# Patient Record
Sex: Male | Born: 1955 | Race: White | Hispanic: No | Marital: Married | Smoking: Never smoker
Health system: Southern US, Community
[De-identification: ages and names within clinical notes are randomized; demographics above are authoritative.]

## PROBLEM LIST (undated history)

## (undated) HISTORY — PX: BACK SURGERY: SHX140

---

## 2018-03-20 ENCOUNTER — Other Ambulatory Visit: Payer: Self-pay

## 2018-03-20 ENCOUNTER — Ambulatory Visit
Admission: EM | Admit: 2018-03-20 | Discharge: 2018-03-20 | Disposition: A | Discharge status: Home / Self Care | Payer: No Typology Code available for payment source | Attending: Family Medicine | Admitting: Family Medicine

## 2018-03-20 DIAGNOSIS — L03213 Periorbital cellulitis: Secondary | ICD-10-CM | POA: Diagnosis not present

## 2018-03-20 MED ORDER — DOXYCYCLINE HYCLATE 100 MG PO TABS: 100.0000 mg | ORAL_TABLET | Freq: Two times a day (BID) | ORAL | 0 refills | Status: DC

## 2018-03-20 NOTE — ED Provider Notes (Signed)
[facility]    CSN: [id number] Arrival date & time: 03/20/18  [id number]     History   Chief Complaint Chief Complaint  Patient presents with  . Eye Pain    HPI Lequita HaltMark Rosamilia is a 62 y.o. male.   62 yo male with a c/o right eyelid pain, redness and swelling for 2 days. Denies any injuries, fevers, chills, drainage.   The history is provided by the patient.    History reviewed. No pertinent past medical history.  There are no active problems to display for this patient.   Past Surgical History:  Procedure Laterality Date  . BACK SURGERY         Home Medications    Prior to Admission medications   Medication Sig Start Date End Date Taking? Authorizing Provider  aspirin EC 81 MG tablet Take 81 mg by mouth daily.   Yes [provider]  doxycycline (VIBRA-TABS) 100 MG tablet Take 1 tablet (100 mg total) by mouth 2 (two) times daily. 03/20/18   Payton Mccallumonty, Giovonnie Trettel, MD    Family History History reviewed. No pertinent family history.  Social History Social History   Tobacco Use  . Smoking status: Never Smoker  . Smokeless tobacco: Never Used  Substance Use Topics  . Alcohol use: Yes    Comment: socially  . Drug use: Not Currently     Allergies   Penicillins and Sulfa antibiotics   Review of Systems Review of Systems   Physical Exam Triage Vital Signs ED Triage Vitals  Enc Vitals Group     BP 03/20/18 0850 (!) 159/93     Pulse Rate 03/20/18 0850 (!) 55     Resp 03/20/18 0850 18     Temp 03/20/18 0850 98.4 F (36.9 C)     Temp Source 03/20/18 0850 Oral     SpO2 03/20/18 0850 100 %     Weight 03/20/18 0847 194 lb (88 kg)     Height 03/20/18 0847 6\' 1"  (1.854 m)     Head Circumference --      Peak Flow --      Pain Score 03/20/18 0847 0     Pain Loc --      Pain Edu? --      Excl. in GC? --    No data found.  Updated Vital Signs BP (!) 159/93 (BP Location: Right Arm)   Pulse (!) 55   Temp 98.4 F (36.9 C) (Oral)   Resp 18    Ht 6\' 1"  (1.854 m)   Wt 88 kg   SpO2 100%   BMI 25.60 kg/m   Visual Acuity Right Eye Distance: 20/25(uncorrected) Left Eye Distance: 20/25(uncorrected) Bilateral Distance: 20/25(uncorrected)  Right Eye Near:   Left Eye Near:    Bilateral Near:     Physical Exam Vitals signs and nursing note reviewed.  Constitutional:      General: He is not in acute distress.    Appearance: He is not diaphoretic.  Eyes:     General:        Right eye: Hordeolum (lower eyelid) present.     Extraocular Movements: Extraocular movements intact.     Conjunctiva/sclera: Conjunctivae normal.     Pupils: Pupils are equal, round, and reactive to light.     Comments: Right lower eyelid with diffuse blanchable erythema and edema   Neurological:     Mental Status: He is alert.      UC Treatments / Results  Labs (all labs  ordered are listed, but only abnormal results are displayed) Labs Reviewed - No data to display  EKG None  Radiology No results found.  Procedures Procedures (including critical care time)  Medications Ordered in UC Medications - No data to display  Initial Impression / Assessment and Plan / UC Course  I have reviewed the triage vital signs and the nursing notes.  Pertinent labs & imaging results that were available during my care of the patient were reviewed by me and considered in my medical decision making (see chart for details).      Final Clinical Impressions(s) / UC Diagnoses   Final diagnoses:  Preseptal cellulitis of right eye    ED Prescriptions    Medication Sig Dispense Auth. Provider   doxycycline (VIBRA-TABS) 100 MG tablet Take 1 tablet (100 mg total) by mouth 2 (two) times daily. 14 tablet Payton Mccallumonty, Issak Goley, MD     1.diagnosis reviewed with patient 2. rx as per orders above; reviewed possible side effects, interactions, risks and benefits  3. Recommend supportive treatment with warm compresses to area 4. Follow-up prn if symptoms worsen or don't  improve    Controlled Substance Prescriptions [place] Controlled Substance Registry consulted? Not Applicable   Payton Mccallumonty, Pawan Knechtel, MD 03/20/18 [phone]

## 2018-03-20 NOTE — ED Triage Notes (Signed)
Patient complains of right eye pain on lower eyelid. Patient also has some swelling under eye that started over last few days.

## 2019-11-26 ENCOUNTER — Encounter
Admission: EM | Disposition: A | Discharge status: Home / Self Care | Payer: Self-pay | Source: Home / Self Care | Attending: Internal Medicine

## 2019-11-26 ENCOUNTER — Encounter: Payer: Self-pay | Admitting: Emergency Medicine

## 2019-11-26 ENCOUNTER — Other Ambulatory Visit: Payer: Self-pay

## 2019-11-26 ENCOUNTER — Emergency Department: Payer: PRIVATE HEALTH INSURANCE

## 2019-11-26 ENCOUNTER — Inpatient Hospital Stay
Admission: EM | Admit: 2019-11-26 | Discharge: 2019-11-27 | DRG: 246 | Disposition: A | Discharge status: Home / Self Care | Payer: PRIVATE HEALTH INSURANCE | Attending: Internal Medicine | Admitting: Internal Medicine

## 2019-11-26 ENCOUNTER — Other Ambulatory Visit
Admission: RE | Admit: 2019-11-26 | Discharge: 2019-11-26 | Disposition: A | Discharge status: Home / Self Care | Payer: PRIVATE HEALTH INSURANCE | Source: Ambulatory Visit | Attending: Family Medicine | Admitting: Family Medicine

## 2019-11-26 DIAGNOSIS — Z0000 Encounter for general adult medical examination without abnormal findings: Secondary | ICD-10-CM | POA: Insufficient documentation

## 2019-11-26 DIAGNOSIS — Z125 Encounter for screening for malignant neoplasm of prostate: Secondary | ICD-10-CM | POA: Insufficient documentation

## 2019-11-26 DIAGNOSIS — Z7982 Long term (current) use of aspirin: Secondary | ICD-10-CM

## 2019-11-26 DIAGNOSIS — I214 Non-ST elevation (NSTEMI) myocardial infarction: Principal | ICD-10-CM

## 2019-11-26 DIAGNOSIS — I2510 Atherosclerotic heart disease of native coronary artery without angina pectoris: Secondary | ICD-10-CM | POA: Diagnosis present

## 2019-11-26 DIAGNOSIS — I249 Acute ischemic heart disease, unspecified: Secondary | ICD-10-CM | POA: Diagnosis not present

## 2019-11-26 DIAGNOSIS — Z882 Allergy status to sulfonamides status: Secondary | ICD-10-CM

## 2019-11-26 DIAGNOSIS — R079 Chest pain, unspecified: Secondary | ICD-10-CM | POA: Insufficient documentation

## 2019-11-26 DIAGNOSIS — Z79899 Other long term (current) drug therapy: Secondary | ICD-10-CM

## 2019-11-26 DIAGNOSIS — Z131 Encounter for screening for diabetes mellitus: Secondary | ICD-10-CM | POA: Diagnosis not present

## 2019-11-26 DIAGNOSIS — Z955 Presence of coronary angioplasty implant and graft: Secondary | ICD-10-CM | POA: Diagnosis not present

## 2019-11-26 DIAGNOSIS — Z880 Allergy status to penicillin: Secondary | ICD-10-CM

## 2019-11-26 DIAGNOSIS — Z1159 Encounter for screening for other viral diseases: Secondary | ICD-10-CM | POA: Diagnosis not present

## 2019-11-26 DIAGNOSIS — Z136 Encounter for screening for cardiovascular disorders: Secondary | ICD-10-CM | POA: Insufficient documentation

## 2019-11-26 DIAGNOSIS — N289 Disorder of kidney and ureter, unspecified: Secondary | ICD-10-CM | POA: Diagnosis present

## 2019-11-26 DIAGNOSIS — Z20822 Contact with and (suspected) exposure to covid-19: Secondary | ICD-10-CM | POA: Diagnosis present

## 2019-11-26 DIAGNOSIS — N400 Enlarged prostate without lower urinary tract symptoms: Secondary | ICD-10-CM | POA: Diagnosis present

## 2019-11-26 HISTORY — PX: LEFT HEART CATH AND CORONARY ANGIOGRAPHY: CATH118249

## 2019-11-26 HISTORY — PX: CORONARY STENT INTERVENTION: CATH118234

## 2019-11-26 LAB — CBC
HCT: 45.9 % (ref 39.0–52.0)
Hemoglobin: 15.4 g/dL (ref 13.0–17.0)
MCH: 28.4 pg (ref 26.0–34.0)
MCHC: 33.6 g/dL (ref 30.0–36.0)
MCV: 84.5 fL (ref 80.0–100.0)
Platelets: 232 10*3/uL (ref 150–400)
RBC: 5.43 MIL/uL (ref 4.22–5.81)
RDW: 14 % (ref 11.5–15.5)
WBC: 7.8 10*3/uL (ref 4.0–10.5)
nRBC: 0 % (ref 0.0–0.2)

## 2019-11-26 LAB — BASIC METABOLIC PANEL
Anion gap: 6 (ref 5–15)
BUN: 32 mg/dL — ABNORMAL HIGH (ref 8–23)
CO2: 24 mmol/L (ref 22–32)
Calcium: 8.9 mg/dL (ref 8.9–10.3)
Chloride: 108 mmol/L (ref 98–111)
Creatinine, Ser: 1.32 mg/dL — ABNORMAL HIGH (ref 0.61–1.24)
GFR calc Af Amer: >60 mL/min (ref >60)
GFR calc non Af Amer: 57 mL/min — ABNORMAL LOW (ref >60)
Glucose, Bld: 84 mg/dL (ref 70–99)
Potassium: 4 mmol/L (ref 3.5–5.1)
Sodium: 138 mmol/L (ref 135–145)

## 2019-11-26 LAB — HEPARIN LEVEL (UNFRACTIONATED): Heparin Unfractionated: <0.1 IU/mL — ABNORMAL LOW (ref 0.30–0.70)

## 2019-11-26 LAB — APTT: aPTT: 36 seconds (ref 24–36)

## 2019-11-26 LAB — TROPONIN I (HIGH SENSITIVITY)
Troponin I (High Sensitivity): 8688 ng/L (ref <18)
Troponin I (High Sensitivity): 7068 ng/L (ref <18)
Troponin I (High Sensitivity): 6827 ng/L (ref <18)

## 2019-11-26 LAB — POCT ACTIVATED CLOTTING TIME: Activated Clotting Time: 346 seconds

## 2019-11-26 LAB — PROTIME-INR
INR: 0.9 (ref 0.8–1.2)
Prothrombin Time: 12.1 seconds (ref 11.4–15.2)

## 2019-11-26 LAB — SARS CORONAVIRUS 2 BY RT PCR (HOSPITAL ORDER, PERFORMED IN ~~LOC~~ HOSPITAL LAB): SARS Coronavirus 2: NEGATIVE

## 2019-11-26 LAB — CKMB (ARMC ONLY): CK, MB: 74.3 ng/mL — ABNORMAL HIGH (ref 0.5–5.0)

## 2019-11-26 SURGERY — LEFT HEART CATH AND CORONARY ANGIOGRAPHY
Anesthesia: Moderate Sedation

## 2019-11-26 MED ORDER — SODIUM CHLORIDE 0.9 % IV SOLN
0.2500 mg/kg/h | INTRAVENOUS | Status: AC
  Filled 2019-11-26: qty 250

## 2019-11-26 MED ORDER — TAMSULOSIN HCL 0.4 MG PO CAPS: 0.4000 mg | ORAL_CAPSULE | Freq: Every day | ORAL | Status: DC

## 2019-11-26 MED ORDER — ASPIRIN 81 MG PO CHEW: 81.0000 mg | CHEWABLE_TABLET | ORAL | Status: DC

## 2019-11-26 MED ORDER — HEPARIN (PORCINE) 25000 UT/250ML-% IV SOLN
1100.0000 [IU]/h | INTRAVENOUS | Status: DC
  Administered 2019-11-26: 1100 [IU]/h via INTRAVENOUS
  Filled 2019-11-26: qty 250

## 2019-11-26 MED ORDER — HEPARIN BOLUS VIA INFUSION
4000.0000 [IU] | Freq: Once | INTRAVENOUS | Status: AC
  Administered 2019-11-26: 4000 [IU] via INTRAVENOUS
  Filled 2019-11-26: qty 4000

## 2019-11-26 MED ORDER — ACETAMINOPHEN 650 MG RE SUPP: 650.0000 mg | Freq: Four times a day (QID) | RECTAL | Status: DC | PRN

## 2019-11-26 MED ORDER — BIVALIRUDIN BOLUS VIA INFUSION - CUPID
INTRAVENOUS | Status: DC | PRN
  Administered 2019-11-26: 67.05 mg via INTRAVENOUS

## 2019-11-26 MED ORDER — LABETALOL HCL 5 MG/ML IV SOLN
INTRAVENOUS | Status: AC
  Filled 2019-11-26: qty 4

## 2019-11-26 MED ORDER — FENTANYL CITRATE (PF) 100 MCG/2ML IJ SOLN
INTRAMUSCULAR | Status: AC
  Filled 2019-11-26: qty 2

## 2019-11-26 MED ORDER — BIVALIRUDIN TRIFLUOROACETATE 250 MG IV SOLR
INTRAVENOUS | Status: AC
  Filled 2019-11-26: qty 250

## 2019-11-26 MED ORDER — ASPIRIN EC 81 MG PO TBEC
81.0000 mg | DELAYED_RELEASE_TABLET | Freq: Every day | ORAL | Status: DC
  Filled 2019-11-26: qty 1

## 2019-11-26 MED ORDER — FENTANYL CITRATE (PF) 100 MCG/2ML IJ SOLN
INTRAMUSCULAR | Status: DC | PRN
Start: 2019-11-26 — End: 2019-11-26
  Administered 2019-11-26: 25 ug via INTRAVENOUS
  Administered 2019-11-26: 50 ug via INTRAVENOUS

## 2019-11-26 MED ORDER — ASPIRIN 81 MG PO CHEW
243.0000 mg | CHEWABLE_TABLET | Freq: Once | ORAL | Status: AC
  Administered 2019-11-26: 243 mg via ORAL
  Filled 2019-11-26: qty 3

## 2019-11-26 MED ORDER — SODIUM CHLORIDE 0.9 % IV SOLN
INTRAVENOUS | Status: AC | PRN
  Administered 2019-11-26: 250 mL via INTRAVENOUS

## 2019-11-26 MED ORDER — SODIUM CHLORIDE 0.9% FLUSH: 3.0000 mL | Freq: Two times a day (BID) | INTRAVENOUS | Status: DC

## 2019-11-26 MED ORDER — SODIUM CHLORIDE 0.9 % WEIGHT BASED INFUSION: 1.0000 mL/kg/h | INTRAVENOUS | Status: DC

## 2019-11-26 MED ORDER — FENTANYL CITRATE (PF) 100 MCG/2ML IJ SOLN
INTRAMUSCULAR | Status: DC | PRN
Start: 2019-11-26 — End: 2019-11-26
  Administered 2019-11-26 (×3): 25 ug via INTRAVENOUS

## 2019-11-26 MED ORDER — LABETALOL HCL 5 MG/ML IV SOLN: 10.0000 mg | INTRAVENOUS | Status: AC | PRN

## 2019-11-26 MED ORDER — ATORVASTATIN CALCIUM 20 MG PO TABS
40.0000 mg | ORAL_TABLET | Freq: Every day | ORAL | Status: DC
  Administered 2019-11-26 – 2019-11-27 (×2): 40 mg via ORAL
  Filled 2019-11-26 (×2): qty 2

## 2019-11-26 MED ORDER — HYDRALAZINE HCL 20 MG/ML IJ SOLN
INTRAMUSCULAR | Status: DC | PRN
  Administered 2019-11-26: 20 mg via INTRAVENOUS

## 2019-11-26 MED ORDER — SODIUM CHLORIDE 0.9% FLUSH: 3.0000 mL | INTRAVENOUS | Status: DC | PRN

## 2019-11-26 MED ORDER — HYDRALAZINE HCL 20 MG/ML IJ SOLN: 10.0000 mg | INTRAMUSCULAR | Status: AC | PRN

## 2019-11-26 MED ORDER — METOPROLOL TARTRATE 25 MG PO TABS
25.0000 mg | ORAL_TABLET | Freq: Two times a day (BID) | ORAL | Status: DC
  Administered 2019-11-26 – 2019-11-27 (×2): 25 mg via ORAL
  Filled 2019-11-26 (×2): qty 1

## 2019-11-26 MED ORDER — TICAGRELOR 90 MG PO TABS
90.0000 mg | ORAL_TABLET | Freq: Two times a day (BID) | ORAL | Status: DC
  Administered 2019-11-26: 90 mg via ORAL
  Filled 2019-11-26: qty 1

## 2019-11-26 MED ORDER — ASPIRIN EC 81 MG PO TBEC: 81.0000 mg | DELAYED_RELEASE_TABLET | Freq: Every day | ORAL | Status: DC

## 2019-11-26 MED ORDER — ASPIRIN 81 MG PO CHEW
81.0000 mg | CHEWABLE_TABLET | Freq: Every day | ORAL | Status: DC
  Administered 2019-11-27: 81 mg via ORAL
  Filled 2019-11-26: qty 1

## 2019-11-26 MED ORDER — SODIUM CHLORIDE 0.9 % WEIGHT BASED INFUSION
1.0000 mL/kg/h | INTRAVENOUS | Status: AC
  Administered 2019-11-26: 1 mL/kg/h via INTRAVENOUS

## 2019-11-26 MED ORDER — SODIUM CHLORIDE 0.9 % IV SOLN
INTRAVENOUS | Status: AC | PRN
  Administered 2019-11-26 (×2): 1.75 mg/kg/h via INTRAVENOUS

## 2019-11-26 MED ORDER — MIDAZOLAM HCL 2 MG/2ML IJ SOLN
INTRAMUSCULAR | Status: DC | PRN
  Administered 2019-11-26 (×2): 1 mg via INTRAVENOUS

## 2019-11-26 MED ORDER — SODIUM CHLORIDE 0.9 % IV SOLN: 250.0000 mL | INTRAVENOUS | Status: DC | PRN

## 2019-11-26 MED ORDER — ACETAMINOPHEN 325 MG PO TABS: 650.0000 mg | ORAL_TABLET | ORAL | Status: DC | PRN

## 2019-11-26 MED ORDER — TICAGRELOR 90 MG PO TABS: 90.0000 mg | ORAL_TABLET | Freq: Two times a day (BID) | ORAL | Status: DC

## 2019-11-26 MED ORDER — IOHEXOL 300 MG/ML  SOLN
INTRAMUSCULAR | Status: DC | PRN
  Administered 2019-11-26: 71 mL

## 2019-11-26 MED ORDER — SODIUM CHLORIDE 0.9 % WEIGHT BASED INFUSION
3.0000 mL/kg/h | INTRAVENOUS | Status: DC
  Administered 2019-11-26: 3 mL/kg/h via INTRAVENOUS

## 2019-11-26 MED ORDER — HEPARIN (PORCINE) IN NACL 1000-0.9 UT/500ML-% IV SOLN
INTRAVENOUS | Status: AC
  Filled 2019-11-26: qty 1000

## 2019-11-26 MED ORDER — ACETAMINOPHEN 325 MG PO TABS: 650.0000 mg | ORAL_TABLET | Freq: Four times a day (QID) | ORAL | Status: DC | PRN

## 2019-11-26 MED ORDER — HEPARIN (PORCINE) IN NACL 1000-0.9 UT/500ML-% IV SOLN
INTRAVENOUS | Status: DC | PRN
  Administered 2019-11-26: 500 mL

## 2019-11-26 MED ORDER — TICAGRELOR 90 MG PO TABS
ORAL_TABLET | ORAL | Status: DC | PRN
  Administered 2019-11-26: 180 mg via ORAL

## 2019-11-26 MED ORDER — IOHEXOL 300 MG/ML  SOLN
INTRAMUSCULAR | Status: DC | PRN
  Administered 2019-11-26: 320 mL via INTRA_ARTERIAL

## 2019-11-26 MED ORDER — HYDRALAZINE HCL 20 MG/ML IJ SOLN
INTRAMUSCULAR | Status: AC
  Filled 2019-11-26: qty 1

## 2019-11-26 MED ORDER — TICAGRELOR 90 MG PO TABS
ORAL_TABLET | ORAL | Status: AC
  Filled 2019-11-26: qty 2

## 2019-11-26 MED ORDER — MIDAZOLAM HCL 2 MG/2ML IJ SOLN
INTRAMUSCULAR | Status: AC
  Filled 2019-11-26: qty 2

## 2019-11-26 MED ORDER — ONDANSETRON HCL 4 MG/2ML IJ SOLN: 4.0000 mg | Freq: Four times a day (QID) | INTRAMUSCULAR | Status: DC | PRN

## 2019-11-26 SURGICAL SUPPLY — 21 items
BALLN TREK RX 2.5X15 (BALLOONS) ×3
BALLOON TREK RX 2.5X15 (BALLOONS) ×1 IMPLANT
CATH INFINITI 5FR ANG PIGTAIL (CATHETERS) ×3 IMPLANT
CATH INFINITI 5FR JL4 (CATHETERS) ×3 IMPLANT
CATH INFINITI JR4 5F (CATHETERS) ×3 IMPLANT
CATH VISTA GUIDE 6FR XB3.5 (CATHETERS) ×3 IMPLANT
DEVICE CLOSURE MYNXGRIP 6/7F (Vascular Products) ×3 IMPLANT
DEVICE SAFEGUARD 24CM (GAUZE/BANDAGES/DRESSINGS) ×3 IMPLANT
KIT ENCORE 26 ADVANTAGE (KITS) ×3 IMPLANT
KIT MANI 3VAL PERCEP (MISCELLANEOUS) ×3 IMPLANT
NEEDLE PERC 18GX7CM (NEEDLE) ×3 IMPLANT
PACK CARDIAC CATH (CUSTOM PROCEDURE TRAY) ×3 IMPLANT
SHEATH AVANTI 5FR X 11CM (SHEATH) ×3 IMPLANT
SHEATH AVANTI 6FR X 11CM (SHEATH) ×3 IMPLANT
STENT RESOLUTE ONYX 2.25X12 (Permanent Stent) ×3 IMPLANT
STENT RESOLUTE ONYX 2.25X26 (Permanent Stent) ×3 IMPLANT
STENT RESOLUTE ONYX 2.5X15 (Permanent Stent) ×3 IMPLANT
STENT RESOLUTE ONYX 2.5X18 (Permanent Stent) ×3 IMPLANT
STENT RESOLUTE ONYX 3.0X12 (Permanent Stent) ×3 IMPLANT
WIRE G HI TQ BMW 190 (WIRE) ×3 IMPLANT
WIRE GUIDERIGHT .035X150 (WIRE) ×3 IMPLANT

## 2019-11-26 NOTE — Consult Note (Signed)
ANTICOAGULATION CONSULT NOTE  Pharmacy Consult for Heparin Infusion Indication: chest pain/ACS  Allergies  Allergen Reactions  . Penicillins Swelling  . Sulfa Antibiotics Rash    Other reaction(s): Unknown Other Reaction: ? RASH    Patient Measurements: Height: 6' (182.9 cm) Weight: 89.4 kg (197 lb) IBW/kg (Calculated) : 77.6 Heparin Dosing Weight: 89.4 kg  Vital Signs: BP: 139/74 (08/31 1135) Pulse Rate: 70 (08/31 1135)  Labs: Recent Labs    11/26/19 0830  CKMB 74.3*  TROPONINIHS 8,688*    CrCl cannot be calculated (No successful lab value found.).   Medical History: History reviewed. No pertinent past medical history.  Medications:  No anticoagulation prior to admission per chart review  Assessment: Patient is a 64 y/o M who presented to [facility] ED on 8/31 with suspected ACS. Troponin elevated to 8688. Pharmacy has been consulted to initiate heparin infusion.  Baseline CBC pending. Baseline aPTT and PT-INR pending.   Goal of Therapy:  Heparin level 0.3-0.7 units/ml Monitor platelets by anticoagulation protocol: Yes   Plan:  --Heparin 4000 unit IV bolus x 1 followed by continuous infusion at 1100 units/hr --HL 6 hours after initiation of infusion --Daily CBC per protocol  Tressie Ellis 11/26/2019,12:06 PM

## 2019-11-26 NOTE — ED Provider Notes (Addendum)
[facility] Emergency Department Provider Note  ____________________________________________   First MD Initiated Contact with Patient 11/26/19 1131     (approximate)  I have reviewed the triage vital signs and the nursing notes.   HISTORY  Chief Complaint Abnormal Lab    HPI Stephen Melendez is a 64 y.o. male  Here with chest pain yesterday. Pt states that symptoms started yesterday while he was golfing. He experienced acute onset of dull, substernal and left-sided chest pressure while golfing. He sat down to rest and felt nauseous, but did not vomit. Sx subsided and he was actually able to golf without difficulty. He was with a physician friend who had him come to the office today. EKG was nonischemic today but trop >8000 so he presents for evaluation. No known h/o CAD. Family h/o cardiac death in father during MV repair. No smoking.        History reviewed. No pertinent past medical history.  There are no problems to display for this patient.   Past Surgical History:  Procedure Laterality Date  . BACK SURGERY      Prior to Admission medications   Medication Sig Start Date End Date Taking? Authorizing Provider  aspirin EC 81 MG tablet Take 81 mg by mouth daily.    [provider]  doxycycline (VIBRA-TABS) 100 MG tablet Take 1 tablet (100 mg total) by mouth 2 (two) times daily. 03/20/18   [name], MD    Allergies Penicillins and Sulfa antibiotics  No family history on file.  Social History Social History   Tobacco Use  . Smoking status: Never Smoker  . Smokeless tobacco: Never Used  Vaping Use  . Vaping Use: Never used  Substance Use Topics  . Alcohol use: Yes    Comment: socially  . Drug use: Not Currently    Review of Systems  Review of Systems  Constitutional: Positive for fatigue. Negative for chills and fever.  HENT: Negative for sore throat.   Respiratory: Positive for chest tightness. Negative for shortness of  breath.   Cardiovascular: Positive for chest pain.  Gastrointestinal: Negative for abdominal pain.  Genitourinary: Negative for flank pain.  Musculoskeletal: Negative for neck pain.  Skin: Negative for rash and wound.  Allergic/Immunologic: Negative for immunocompromised state.  Neurological: Negative for weakness and numbness.  Hematological: Does not bruise/bleed easily.  All other systems reviewed and are negative.    ____________________________________________  PHYSICAL EXAM:      VITAL SIGNS: ED Triage Vitals  Enc Vitals Group     BP      Pulse      Resp      Temp      Temp src      SpO2      Weight      Height      Head Circumference      Peak Flow      Pain Score      Pain Loc      Pain Edu?      Excl. in GC?      Physical Exam Vitals and nursing note reviewed.  Constitutional:      General: He is not in acute distress.    Appearance: He is well-developed.  HENT:     Head: Normocephalic and atraumatic.  Eyes:     Conjunctiva/sclera: Conjunctivae normal.  Cardiovascular:     Rate and Rhythm: Normal rate and regular rhythm.     Heart sounds: Normal heart sounds.  Pulmonary:  Effort: Pulmonary effort is normal. No respiratory distress.     Breath sounds: No wheezing.  Abdominal:     General: There is no distension.  Musculoskeletal:     Cervical back: Neck supple.  Skin:    General: Skin is warm.     Capillary Refill: Capillary refill takes less than 2 seconds.     Findings: No rash.  Neurological:     Mental Status: He is alert and oriented to person, place, and time.     Motor: No abnormal muscle tone.       ____________________________________________   LABS (all labs ordered are listed, but only abnormal results are displayed)  Labs Reviewed  SARS CORONAVIRUS 2 BY RT PCR (HOSPITAL ORDER, PERFORMED IN [facility])  CBC  BASIC METABOLIC PANEL  PROTIME-INR  APTT  HEPARIN LEVEL (UNFRACTIONATED)  TROPONIN I (HIGH  SENSITIVITY)    ____________________________________________  EKG: Normal sinus rhythm, ventricular rate 74.  QRS 88, QTc 461.  T wave inversions noted in inferior and lateral, V5/V6 precordial leads.  No acute ST elevations.  Findings concerning for possible inferolateral ischemia. ________________________________________  RADIOLOGY All imaging, including plain films, CT scans, and ultrasounds, independently reviewed by me, and interpretations confirmed via formal radiology reads.  ED MD interpretation:   Chest x-ray: Possible interstitial prominence  Official radiology report(s): DG Chest 2 View  Result Date: 11/26/2019 CLINICAL DATA:  Chest pain. EXAM: CHEST - 2 VIEW COMPARISON:  No prior. FINDINGS: Mediastinum and hilar structures normal. Mild bilateral interstitial prominence. Mild pneumonitis can not be excluded. No pleural effusion or pneumothorax. Heart size normal. No acute bony abnormality. IMPRESSION: Mild bilateral interstitial prominence. Mild pneumonitis cannot be excluded. Electronically Signed   By: Maisie Fus  Register   On: 11/26/2019 12:05    ____________________________________________  PROCEDURES   Procedure(s) performed (including Critical Care):  .Critical Care Performed by: Shaune Pollack, MD Authorized by: Shaune Pollack, MD   Critical care provider statement:    Critical care time (minutes):  35   Critical care time was exclusive of:  Separately billable procedures and treating other patients and teaching time   Critical care was necessary to treat or prevent imminent or life-threatening deterioration of the following conditions:  Cardiac failure, circulatory failure and respiratory failure   Critical care was time spent personally by me on the following activities:  Development of treatment plan with patient or surrogate, discussions with consultants, evaluation of patient's response to treatment, examination of patient, obtaining history from patient or  surrogate, ordering and performing treatments and interventions, ordering and review of laboratory studies, ordering and review of radiographic studies, pulse oximetry, re-evaluation of patient's condition and review of old charts   I assumed direction of critical care for this patient from another provider in my specialty: no      ____________________________________________  INITIAL IMPRESSION / MDM / ASSESSMENT AND PLAN / ED COURSE  As part of my medical decision making, I reviewed the following data within the electronic MEDICAL RECORD NUMBER Nursing notes reviewed and incorporated, Old chart reviewed, Notes from prior ED visits, and [place] Controlled Substance Database       *Haroun Cotham was evaluated in Emergency Department on 11/26/2019 for the symptoms described in the history of present illness. He was evaluated in the context of the global COVID-19 pandemic, which necessitated consideration that the patient might be at risk for infection with the SARS-CoV-2 virus that causes COVID-19. Institutional protocols and algorithms that pertain to the evaluation of patients  at risk for COVID-19 are in a state of rapid change based on information released by regulatory bodies including the CDC and federal and state organizations. These policies and algorithms were followed during the patient's care in the ED.  Some ED evaluations and interventions may be delayed as a result of limited staffing during the pandemic.*     Medical Decision Making:  64 yo M here with NSTEMI vs STEMI yesterday, now without persistent ST elevations. Trop >8000 on outside labs. EKG shows inferolateral TWI but no elevations. Dr. Lady Gary consulted and will start on heparin, admit for cath. NPO. ASA given. COVID pending.  ____________________________________________  FINAL CLINICAL IMPRESSION(S) / ED DIAGNOSES  Final diagnoses:  NSTEMI (non-ST elevated myocardial infarction) (HCC)     MEDICATIONS GIVEN DURING THIS  VISIT:  Medications  aspirin EC tablet 81 mg (has no administration in time range)  atorvastatin (LIPITOR) tablet 40 mg (has no administration in time range)  metoprolol tartrate (LOPRESSOR) tablet 25 mg (has no administration in time range)  sodium chloride flush (NS) 0.9 % injection 3 mL (has no administration in time range)  heparin bolus via infusion 4,000 Units (has no administration in time range)  heparin ADULT infusion 100 units/mL (25000 units/29mL sodium chloride 0.45%) (has no administration in time range)  aspirin chewable tablet 243 mg (243 mg Oral Given 11/26/19 1139)     ED Discharge Orders    None       Note:  This document was prepared using Dragon voice recognition software and may include unintentional dictation errors.   Shaune Pollack, MD 11/26/19 Harrold Donath    Shaune Pollack, MD 12/13/19 [phone]

## 2019-11-26 NOTE — ED Notes (Signed)
Dr. Fath at bedside  

## 2019-11-26 NOTE — ED Notes (Signed)
Patient went to bathroom and changed into two gowns. Patient's wife has belongings except for glasses and cell phone. Report given to [employer].

## 2019-11-26 NOTE — Consult Note (Addendum)
Cardiology Consultation Note    Patient ID: Stephen Melendez, MRN: [id number], DOB/AGE: 64-Sep-1957 64 y.o. Admit date: 11/26/2019   Date of Consult: 11/26/2019 Primary Physician: [name], MD Primary Cardiologist:    Chief Complaint: chest pain Reason for Consultation: nstemi Requesting MD: Dr. Erma Heritage  HPI: Stephen Melendez is a 64 y.o. male with history of no prior cardiac or medical history who had an episode of chest pain yesterday afternoon. No pain since. Has ruled in for a nstemi.  Patient had no cardiac history is quite active without difficulty.  Yesterday prior to playing golf he had midsternal chest pain which radiated to his left arm jaw and hand.  Lasted approximately 1 hour before resolving.  Did not seek medical advice and played 18 holes of golf without any difficulty.  Has had no further chest pain.  Presented to his primary care physician's office this morning where troponin was drawn which showed a value of 8688.  There were no acute injury current on EKG but some ST depression in the lateral leads consistent with probable subacute non-ST elevation myocardial infarction in the inferior wall.  Again hemodynamically stable and pain-free.  History reviewed. No pertinent past medical history.    Surgical History:  Past Surgical History:  Procedure Laterality Date  . BACK SURGERY       Home Meds: Prior to Admission medications   Medication Sig Start Date End Date Taking? Authorizing Provider  aspirin EC 81 MG tablet Take 81 mg by mouth daily.    [provider]  doxycycline (VIBRA-TABS) 100 MG tablet Take 1 tablet (100 mg total) by mouth 2 (two) times daily. 03/20/18   [name], MD    Inpatient Medications:  . aspirin EC  81 mg Oral Daily  . atorvastatin  40 mg Oral Daily  . metoprolol tartrate  25 mg Oral BID  . sodium chloride flush  3 mL Intravenous Q12H     Allergies:  Allergies  Allergen Reactions  . Penicillins Swelling  . Sulfa Antibiotics  Rash    Other reaction(s): Unknown Other Reaction: ? RASH    Social History   Socioeconomic History  . Marital status: Married    Spouse name: Not on file  . Number of children: Not on file  . Years of education: Not on file  . Highest education level: Not on file  Occupational History  . Not on file  Tobacco Use  . Smoking status: Never Smoker  . Smokeless tobacco: Never Used  Vaping Use  . Vaping Use: Never used  Substance and Sexual Activity  . Alcohol use: Yes    Comment: socially  . Drug use: Not Currently  . Sexual activity: Not on file  Other Topics Concern  . Not on file  Social History Narrative  . Not on file   Social Determinants of Health   Financial Resource Strain:   . Difficulty of Paying Living Expenses: Not on file  Food Insecurity:   . Worried About Programme researcher, broadcasting/film/video in the Last Year: Not on file  . Ran Out of Food in the Last Year: Not on file  Transportation Needs:   . Lack of Transportation (Medical): Not on file  . Lack of Transportation (Non-Medical): Not on file  Physical Activity:   . Days of Exercise per Week: Not on file  . Minutes of Exercise per Session: Not on file  Stress:   . Feeling of Stress : Not on file  Social Connections:   .  Frequency of Communication with Friends and Family: Not on file  . Frequency of Social Gatherings with Friends and Family: Not on file  . Attends Religious Services: Not on file  . Active Member of Clubs or Organizations: Not on file  . Attends Banker Meetings: Not on file  . Marital Status: Not on file  Intimate Partner Violence:   . Fear of Current or Ex-Partner: Not on file  . Emotionally Abused: Not on file  . Physically Abused: Not on file  . Sexually Abused: Not on file     No family history on file.   Review of Systems: A 12-system review of systems was performed and is negative except as noted in the HPI.  Labs: Recent Labs    11/26/19 0830  CKMB 74.3*   No results  found for: WBC, HGB, HCT, MCV, PLT No results for input(s): NA, K, CL, CO2, BUN, CREATININE, CALCIUM, PROT, BILITOT, ALKPHOS, ALT, AST, GLUCOSE in the last 168 hours.  Invalid input(s): LABALBU No results found for: CHOL, HDL, LDLCALC, TRIG No results found for: DDIMER  Radiology/Studies:  No results found.  Wt Readings from Last 3 Encounters:  11/26/19 89.4 kg  03/20/18 88 kg    EKG: nsr with non specific st t wave changes.   Physical Exam:  Blood pressure 139/74, pulse 70, resp. rate 20, height 6' (1.829 m), weight 89.4 kg, SpO2 99 %. Body mass index is 26.72 kg/m. General: Well developed, well nourished, in no acute distress. Head: Normocephalic, atraumatic, sclera non-icteric, no xanthomas, nares are without discharge.  Neck: Negative for carotid bruits. JVD not elevated. Lungs: Clear bilaterally to auscultation without wheezes, rales, or rhonchi. Breathing is unlabored. Heart: RRR with S1 S2. No murmurs, rubs, or gallops appreciated. Abdomen: Soft, non-tender, non-distended with normoactive bowel sounds. No hepatomegaly. No rebound/guarding. No obvious abdominal masses. Msk:  Strength and tone appear normal for age. Extremities: No clubbing or cyanosis. No edema.  Distal pedal pulses are 2+ and equal bilaterally. Neuro: Alert and oriented X 3. No facial asymmetry. No focal deficit. Moves all extremities spontaneously. Psych:  Responds to questions appropriately with a normal affect.     Assessment and Plan  Pt with classic chest pain yesterday lasting approximately 1 hour. Resolved and he played 18 holes of golf with out problems. Has hads no further chest pain. EKG showes NSR with borderline inferior st depression. Troponin initailly this am was 8688. Second troponin pending.   NSTEMI-symptoms classic for ischemia yesterday. Pain free at present with no st elevation. Appears to have ruled in for a nstemi yesterday. WIll place on asa, heparin, high intensity statin and beta  blockers. Proceed with cardiac cath this afternoon to guide further therapy.   Signed, Dalia Heading MD 11/26/2019, 12:06 PM Pager: [phone]

## 2019-11-26 NOTE — H&P (Addendum)
History and Physical:    Stephen Melendez   HUT:[id number] DOB: 05/14/1955 DOA: 11/26/2019  Referring MD/provider: Dr. Erma Heritage PCP: [name], MD   Patient coming from: Home  Chief Complaint: Sent over from PCP for troponin of 9000.  History of Present Illness:   Stephen Melendez is an 64 y.o. male with PMH significant only for BPH who was in his usual state of health until yesterday morning.  He had an episode of substernal chest pressure with radiation down his left arm associated with some mild diaphoresis.  He was playing golf at the time with his friend who is a physician, told him he should go to the ED for evaluation.  Patient felt that he was fine and continued to play golf.  Patient states by the third hole his chest discomfort had completely resolved.  He finished playing 18 holes and then had dinner that night with his friend who convinced him to come see him this morning.  Patient went to follow-up this morning and was noted to have an elevated troponin of 9000.  Patient states he feels perfectly fine.  Notes that he had no difficulty with shortness of breath or further chest pain after the third hole.  At present he feels no different than he normally does.  Patient denies any previous difficulty with chest pain or shortness of breath or DOE.  Notes he is very healthy.  Patient's main concern is how much this is going to cost him.  Notes he only has catastrophic health insurance and is afraid this is, cost him too much.  He is requesting minimize sedation of as many costs as it is safe.  ED Course:  The patient was noted to be hemodynamically stable without any chest pain.  EKG revealed T wave inversions inferolaterally but no ST elevations.  Repeat troponin here was around 8000.  Cardiology was consulted who started him on a heparin drip and is planning for catheterization this afternoon.  ROS:   ROS   Review of Systems: General: Denies fever, chills, malaise,    Respiratory: No cough or hemoptysis. GI: Denies nausea, vomiting, diarrhea or constipation GU: Denies dysuria, frequency or hematuria CNS: Denies HA, dizziness, confusion, new weakness or clumsiness. Blood/lymphatics: Denies easy bruising or bleeding Mood/affect: Denies anxiety/depression    Past Medical History:   History reviewed. No pertinent past medical history.  Past Surgical History:   Past Surgical History:  Procedure Laterality Date  . BACK SURGERY      Social History:   Social History   Socioeconomic History  . Marital status: Married    Spouse name: Not on file  . Number of children: Not on file  . Years of education: Not on file  . Highest education level: Not on file  Occupational History  . Not on file  Tobacco Use  . Smoking status: Never Smoker  . Smokeless tobacco: Never Used  Vaping Use  . Vaping Use: Never used  Substance and Sexual Activity  . Alcohol use: Yes    Comment: socially  . Drug use: Not Currently  . Sexual activity: Not on file  Other Topics Concern  . Not on file  Social History Narrative  . Not on file   Social Determinants of Health   Financial Resource Strain:   . Difficulty of Paying Living Expenses: Not on file  Food Insecurity:   . Worried About Programme researcher, broadcasting/film/video in the Last Year: Not on file  . Ran Out  of Food in the Last Year: Not on file  Transportation Needs:   . Lack of Transportation (Medical): Not on file  . Lack of Transportation (Non-Medical): Not on file  Physical Activity:   . Days of Exercise per Week: Not on file  . Minutes of Exercise per Session: Not on file  Stress:   . Feeling of Stress : Not on file  Social Connections:   . Frequency of Communication with Friends and Family: Not on file  . Frequency of Social Gatherings with Friends and Family: Not on file  . Attends Religious Services: Not on file  . Active Member of Clubs or Organizations: Not on file  . Attends Banker  Meetings: Not on file  . Marital Status: Not on file  Intimate Partner Violence:   . Fear of Current or Ex-Partner: Not on file  . Emotionally Abused: Not on file  . Physically Abused: Not on file  . Sexually Abused: Not on file    Allergies   Penicillins and Sulfa antibiotics  Family history:   No family history on file.  Current Medications:   Prior to Admission medications   Medication Sig Start Date End Date Taking? Authorizing Provider  ascorbic acid (VITAMIN C) 1000 MG tablet Take 3,000 mg by mouth daily.    Yes [provider]  aspirin EC 81 MG tablet Take 81 mg by mouth daily.   Yes [provider]  Cholecalciferol 50 MCG (2000 UT) CAPS Take 1 capsule by mouth daily.    Yes [provider]  ferrous sulfate 325 (65 FE) MG tablet Take 325 mg by mouth daily with breakfast.    Yes [provider]  Omega-3 Fatty Acids (FISH OIL) 1000 MG CAPS Take 1 capsule by mouth daily.   Yes [provider]  tamsulosin (FLOMAX) 0.4 MG CAPS capsule Take 0.4 mg by mouth daily after supper.   Yes [provider]  vitamin B-12 (CYANOCOBALAMIN) 500 MCG tablet Take 500 mcg by mouth daily.   Yes [provider]  doxycycline (VIBRA-TABS) 100 MG tablet Take 1 tablet (100 mg total) by mouth 2 (two) times daily. Patient not taking: Reported on 11/26/2019 03/20/18   Stephen Mccallum, MD    Physical Exam:   Vitals:   11/26/19 1133 11/26/19 1135 11/26/19 1351  BP:  139/74 (!) 107/56  Pulse:  70 61  Resp:  20 15  SpO2:  99% 97%  Weight: 89.4 kg    Height: 6' (1.829 m)       Physical Exam: Blood pressure (!) 107/56, pulse 61, resp. rate 15, height 6' (1.829 m), weight 89.4 kg, SpO2 97 %. Gen: Remarkably well-appearing man in good spirits sitting up in bed in no acute physical distress, although appears anxious about cost of this hospitalization  eyes: sclera anicteric, conjuctiva clear CVS: S1-S2, regulary, no gallops, no  murmurs Respiratory: Excellent air entry bilaterally with no rales or other adventitious sounds  GI: NABS, soft, NT  LE: No edema. No cyanosis Neuro: A/O x 3, Moving all extremities equally with normal strength, CN 3-12 intact, grossly nonfocal.  Psych: patient is logical and coherent, judgement and insight appear normal, mood and affect appropriate to situation. Skin: no rashes or lesions or ulcers,    Data Review:    Labs: Basic Metabolic Panel: Recent Labs  Lab 11/26/19 1202  NA 138  K 4.0  CL 108  CO2 24  GLUCOSE 84  BUN 32*  CREATININE 1.32*  CALCIUM  8.9   Liver Function Tests: No results for input(s): AST, ALT, ALKPHOS, BILITOT, PROT, ALBUMIN in the last 168 hours. No results for input(s): LIPASE, AMYLASE in the last 168 hours. No results for input(s): AMMONIA in the last 168 hours. CBC: Recent Labs  Lab 11/26/19 1202  WBC 7.8  HGB 15.4  HCT 45.9  MCV 84.5  PLT 232   Cardiac Enzymes: Recent Labs  Lab 11/26/19 0830  CKMB 74.3*    BNP (last 3 results) No results for input(s): PROBNP in the last 8760 hours. CBG: No results for input(s): GLUCAP in the last 168 hours.  Urinalysis No results found for: COLORURINE, APPEARANCEUR, LABSPEC, PHURINE, GLUCOSEU, HGBUR, BILIRUBINUR, KETONESUR, PROTEINUR, UROBILINOGEN, NITRITE, LEUKOCYTESUR    Radiographic Studies: DG Chest 2 View  Result Date: 11/26/2019 CLINICAL DATA:  Chest pain. EXAM: CHEST - 2 VIEW COMPARISON:  No prior. FINDINGS: Mediastinum and hilar structures normal. Mild bilateral interstitial prominence. Mild pneumonitis can not be excluded. No pleural effusion or pneumothorax. Heart size normal. No acute bony abnormality. IMPRESSION: Mild bilateral interstitial prominence. Mild pneumonitis cannot be excluded. Electronically Signed   By: Maisie Fus  Register   On: 11/26/2019 12:05    EKG: Independently reviewed.  Sinus rhythm at 75.  Normal intervals.  Normal axis.  T wave inversions to 8F and V5 through  V6.   Assessment/Plan:   Principal Problem:   ACS (acute coronary syndrome) (HCC)  64 year old man presents the day after he had an NSTEMI.  ACS Patient seen by Dr. Lady Gary in ED Patient received aspirin, metoprolol and atorvastatin He was started on a heparin drip Plan is for cardiac catheterization today Patient feels normal and would like to be discharged as soon as possible as he is self pay and is worried about cost. Troponins are already trending down on repeat. Lungs are clear, no evidence of heart failure or complications from ACS at present  Per Dr Lady Gary after cath: Stenting the om2 and the groove circumflex. Will get 3-4 stents. Will be on brilinta, asa, atorbastatin and beta blocker. Ef was normal. Hope home tomorrow if all goes well.  BPH Continue tamsulosin  Renal insufficiency Unclear what patient's baseline creatinine is Would need to hydrate well especially after dye load he is going to receive later on today Follow closely    Other information:   DVT prophylaxis: On heparin drip ordered. Code Status: Full Family Communication: Patient states his wife was just here and is fully aware of plan Disposition Plan: Home, patient would like to be discharged ASAP given cost of hospitalization to him Consults called: Cardiology Admission status: Observation  Ryonna Cimini Tublu Quin Mat[place]ia [facility]  If 7PM-7AM, please contact night-coverage [email or web address] Password [facility] 11/26/2019, 2:36 PM

## 2019-11-27 ENCOUNTER — Encounter: Payer: Self-pay | Admitting: Internal Medicine

## 2019-11-27 DIAGNOSIS — I214 Non-ST elevation (NSTEMI) myocardial infarction: Principal | ICD-10-CM

## 2019-11-27 DIAGNOSIS — I249 Acute ischemic heart disease, unspecified: Secondary | ICD-10-CM | POA: Diagnosis not present

## 2019-11-27 DIAGNOSIS — Z79899 Other long term (current) drug therapy: Secondary | ICD-10-CM | POA: Diagnosis not present

## 2019-11-27 DIAGNOSIS — Z7982 Long term (current) use of aspirin: Secondary | ICD-10-CM | POA: Diagnosis not present

## 2019-11-27 DIAGNOSIS — Z880 Allergy status to penicillin: Secondary | ICD-10-CM | POA: Diagnosis not present

## 2019-11-27 DIAGNOSIS — Z20822 Contact with and (suspected) exposure to covid-19: Secondary | ICD-10-CM | POA: Diagnosis present

## 2019-11-27 DIAGNOSIS — Z955 Presence of coronary angioplasty implant and graft: Secondary | ICD-10-CM

## 2019-11-27 DIAGNOSIS — N289 Disorder of kidney and ureter, unspecified: Secondary | ICD-10-CM | POA: Diagnosis present

## 2019-11-27 DIAGNOSIS — I2510 Atherosclerotic heart disease of native coronary artery without angina pectoris: Secondary | ICD-10-CM | POA: Diagnosis present

## 2019-11-27 DIAGNOSIS — Z882 Allergy status to sulfonamides status: Secondary | ICD-10-CM | POA: Diagnosis not present

## 2019-11-27 DIAGNOSIS — N400 Enlarged prostate without lower urinary tract symptoms: Secondary | ICD-10-CM | POA: Diagnosis present

## 2019-11-27 LAB — BASIC METABOLIC PANEL
Anion gap: 7 (ref 5–15)
BUN: 24 mg/dL — ABNORMAL HIGH (ref 8–23)
CO2: 23 mmol/L (ref 22–32)
Calcium: 8.6 mg/dL — ABNORMAL LOW (ref 8.9–10.3)
Chloride: 108 mmol/L (ref 98–111)
Creatinine, Ser: 1.27 mg/dL — ABNORMAL HIGH (ref 0.61–1.24)
GFR calc Af Amer: >60 mL/min (ref >60)
GFR calc non Af Amer: 59 mL/min — ABNORMAL LOW (ref >60)
Glucose, Bld: 91 mg/dL (ref 70–99)
Potassium: 4.3 mmol/L (ref 3.5–5.1)
Sodium: 138 mmol/L (ref 135–145)

## 2019-11-27 LAB — CBC
HCT: 42.1 % (ref 39.0–52.0)
Hemoglobin: 14 g/dL (ref 13.0–17.0)
MCH: 28.5 pg (ref 26.0–34.0)
MCHC: 33.3 g/dL (ref 30.0–36.0)
MCV: 85.7 fL (ref 80.0–100.0)
Platelets: 206 10*3/uL (ref 150–400)
RBC: 4.91 MIL/uL (ref 4.22–5.81)
RDW: 14.3 % (ref 11.5–15.5)
WBC: 9 10*3/uL (ref 4.0–10.5)
nRBC: 0 % (ref 0.0–0.2)

## 2019-11-27 LAB — HIV ANTIBODY (ROUTINE TESTING W REFLEX): HIV Screen 4th Generation wRfx: NONREACTIVE

## 2019-11-27 MED ORDER — CLOPIDOGREL BISULFATE 75 MG PO TABS: 75.0000 mg | ORAL_TABLET | Freq: Every day | ORAL | Status: DC

## 2019-11-27 MED ORDER — METOPROLOL TARTRATE 25 MG PO TABS: 25.0000 mg | ORAL_TABLET | Freq: Every day | ORAL | 0 refills | Status: AC

## 2019-11-27 MED ORDER — ATORVASTATIN CALCIUM 40 MG PO TABS: 40.0000 mg | ORAL_TABLET | Freq: Every day | ORAL | 0 refills | Status: AC

## 2019-11-27 MED ORDER — CLOPIDOGREL BISULFATE 75 MG PO TABS: 75.0000 mg | ORAL_TABLET | Freq: Every day | ORAL | 0 refills | Status: AC

## 2019-11-27 MED ORDER — CLOPIDOGREL BISULFATE 75 MG PO TABS
150.0000 mg | ORAL_TABLET | Freq: Once | ORAL | Status: DC
  Administered 2019-11-27: 150 mg via ORAL
  Filled 2019-11-27: qty 2

## 2019-11-27 NOTE — Progress Notes (Signed)
Discharge instructions explained to pt / verbalized an understanding/ iv and tele removed/ pt and spouse refused wheelchair for discharge

## 2019-11-27 NOTE — Progress Notes (Signed)
Patient Name: Stephen Melendez Date of Encounter: 11/27/2019  Hospital Problem List     Principal Problem:   ACS (acute coronary syndrome) [facility]) Active Problems:   Acute coronary syndromes [facility])    Patient Profile     64 year old male with no prior cardiac history who suffered a non-ST elevation myocardial infarction yesterday while playing golf.  He presented to the emergency room after outpatient serum troponin was 8000.  He ruled in for non-ST elevation myocardial infarction.  Left heart cath done yesterday revealed 2 lesions in the groove circumflex and 2 in the large OM 2 and a circumflex dominant vessel.  Received 5 drug-eluting stents.  Doing well post cath and PCI.  Subjective   No chest pain.  No cardiac problems in the cath site in the groin other than mild ecchymosis.  No bruit.  Distal pulses intact.  Anxious to go home.  Discussed smoking cessation.  Inpatient Medications    . aspirin  81 mg Oral Daily  . aspirin EC  81 mg Oral Daily  . atorvastatin  40 mg Oral Daily  . clopidogrel  150 mg Oral Once  . [START ON 11/28/2019] clopidogrel  75 mg Oral Daily  . metoprolol tartrate  25 mg Oral BID  . sodium chloride flush  3 mL Intravenous Q12H  . sodium chloride flush  3 mL Intravenous Q12H  . sodium chloride flush  3 mL Intravenous Q12H  . tamsulosin  0.4 mg Oral QPC supper    Vital Signs    Vitals:   11/26/19 1900 11/26/19 1930 11/26/19 2113 11/27/19 0753  BP: 110/63 119/71 130/65 134/72  Pulse: (!) 58 (!) 54 60 (!) 54  Resp: 14 13 20 16   Temp:   98.2 F (36.8 C) 98.4 F (36.9 C)  TempSrc:   Oral Oral  SpO2: 99% 96% 98% 99%  Weight:      Height:        Intake/Output Summary (Last 24 hours) at 11/27/2019 0841 Last data filed at 11/26/2019 2100 Gross per 24 hour  Intake --  Output 350 ml  Net -350 ml   Filed Weights   11/26/19 1133 11/26/19 1519  Weight: 89.4 kg 89.4 kg    Physical Exam    GEN: Well nourished, well developed, in no acute distress.   HEENT: normal.  Neck: Supple, no JVD, carotid bruits, or masses. Cardiac: RRR, no murmurs, rubs, or gallops. No clubbing, cyanosis, edema.  Radials/DP/PT 2+ and equal bilaterally.  Respiratory:  Respirations regular and unlabored, clear to auscultation bilaterally. GI: Soft, nontender, nondistended, BS + x 4. MS: no deformity or atrophy. Skin: warm and dry, no rash. Neuro:  Strength and sensation are intact. Psych: Normal affect.  Labs    CBC Recent Labs    11/26/19 1202 11/27/19 0455  WBC 7.8 9.0  HGB 15.4 14.0  HCT 45.9 42.1  MCV 84.5 85.7  PLT 232 206   Basic Metabolic Panel Recent Labs    01/27/20 1202 11/27/19 0455  NA 138 138  K 4.0 4.3  CL 108 108  CO2 24 23  GLUCOSE 84 91  BUN 32* 24*  CREATININE 1.32* 1.27*  CALCIUM 8.9 8.6*   Liver Function Tests No results for input(s): AST, ALT, ALKPHOS, BILITOT, PROT, ALBUMIN in the last 72 hours. No results for input(s): LIPASE, AMYLASE in the last 72 hours. Cardiac Enzymes Recent Labs    11/26/19 0830  CKMB 74.3*   BNP No results for input(s): BNP in the  last 72 hours. D-Dimer No results for input(s): DDIMER in the last 72 hours. Hemoglobin A1C No results for input(s): HGBA1C in the last 72 hours. Fasting Lipid Panel No results for input(s): CHOL, HDL, LDLCALC, TRIG, CHOLHDL, LDLDIRECT in the last 72 hours. Thyroid Function Tests No results for input(s): TSH, T4TOTAL, T3FREE, THYROIDAB in the last 72 hours.  Invalid input(s): FREET3  Telemetry    Sinus rhythm no ischemia or arrhythmia  ECG    Sinus rhythm with no ischemia  Radiology    DG Chest 2 View  Result Date: 11/26/2019 CLINICAL DATA:  Chest pain. EXAM: CHEST - 2 VIEW COMPARISON:  No prior. FINDINGS: Mediastinum and hilar structures normal. Mild bilateral interstitial prominence. Mild pneumonitis can not be excluded. No pleural effusion or pneumothorax. Heart size normal. No acute bony abnormality. IMPRESSION: Mild bilateral interstitial  prominence. Mild pneumonitis cannot be excluded. Electronically Signed   By: Maisie Fus  Register   On: 11/26/2019 12:05   CARDIAC CATHETERIZATION  Result Date: 11/26/2019  2nd Mrg-1 lesion is 75% stenosed.  2nd Mrg-2 lesion is 90% stenosed.  LPAV lesion is 75% stenosed.  Dist Cx lesion is 75% stenosed.  LM normal LAD normal LCx dominant vessel. There is a 75% stenosis in the proximal to mid groove circumflex and mid to distal groove circumflex. There is a large om 2 which has serial lesions. Small nondominant rca with no disease LV ef appears well preserved. Will need coonsideration for pci of the groove circ lesions and om2 lesions.   CARDIAC CATHETERIZATION  Result Date: 11/26/2019  1st Mrg-1 lesion is 70% stenosed.  1st Mrg-2 lesion is 99% stenosed.  LPDA lesion is 70% stenosed.  Post intervention, there is a 0% residual stenosis.  Post intervention, there is a 0% residual stenosis.  Dist Cx lesion is 70% stenosed with 70% stenosed side branch in LPAV.  Post intervention, there is a 0% residual stenosis.  Post intervention, the side branch was reduced to 0% residual stenosis.  A stent was successfully placed.  A stent was successfully placed.  A drug-eluting stent was successfully placed using a STENT RESOLUTE ONYX 2.25X12.  Post intervention, there is a 0% residual stenosis.  A drug-eluting stent was successfully placed using a STENT RESOLUTE ONYX 3.0X12.  A drug-eluting stent was successfully placed using a STENT RESOLUTE ONYX 2.5X18.  Conclusion Successful PCI and stent of multiple lesions and OM1 distal circumflex and PDA all receiving DES stents reducing all lesions down to 0 all maintained TIMI-3 flow a total of 5 different DES stents were placed to cover 5 distinct lesions Minx was deployed at the end of the case Cardiac management was then transferred back to Dr. Lady Gary    Assessment & Plan    64 year old male status post non-ST ovation myocardial infarction.  Underwent PCI V OM  and groove circumflex.  Received a 2.25 x 12 mm stent and 1, 3.0 x 12 mm stent more proximally in the OM1, 2.2 2.5 x 18 mm stent in the groove circumflex.  1.  Non-ST elevation myocardial infarction.  Status post PCI as mentioned above.  Brilinta caused severe shortness of breath.  We will switch to Plavix and give him a loading dose this morning and switch to 75 mg daily starting tomorrow.  Would discharge on enteric-coated aspirin 81 mg daily, Plavix 75 mg daily, atorvastatin 40 mg daily, metoprolol succinate 25 mg daily.  Cardiac rehab has been ordered.  LV function normal, ACE inhibitor not indicated.  Okay for discharge  today with outpatient follow-up in our office in 1 week.  Signed, Darlin Priestly Langford Carias MD 11/27/2019, 8:41 AM  Pager: [phone]

## 2019-11-27 NOTE — TOC Transition Note (Signed)
Transition of Care [facility]) - CM/SW Discharge Note   Patient Details  Name: Stephen Melendez MRN: [id number] Date of Birth: 11-27-1955  [facility] of [facility] CM/SW Contact:  Shawn Route, RN Phone Number: 11/27/2019, 9:56 AM   Clinical Narrative:     Spoke with patient about medication assistance, He is aware of [employer] [facility] and already uses that program.  He does have insurance, just a high deductible plan and his concern mainly is hospital bill in general.  I encouraged him to talk with financial counselors when bill arrives.  He reported no further needs. No further TOC needs at this time, please re-consult for new needs.     Final next level of care: Home/Self Care Barriers to Discharge: No Barriers Identified   Patient Goals and CMS Choice        Discharge Placement                       Discharge Plan and Services   Discharge Planning Services: CM Consult                                 Social Determinants of Health (SDOH) Interventions     Readmission Risk Interventions No flowsheet data found.

## 2019-11-27 NOTE — Discharge Instructions (Signed)
Heart Attack A heart attack occurs when blood and oxygen supply to the heart is cut off. A heart attack causes damage to the heart that cannot be fixed. A heart attack is also called a myocardial infarction, or MI. If you think you are having a heart attack, do not wait to see if the symptoms will go away. Get medical help right away. What are the causes? This condition may be caused by:  A fatty substance (plaque) in the blood vessels (arteries). This can block the flow of blood to the heart.  A blood clot in the blood vessels that go to the heart. The blood clot blocks blood flow.  Low blood pressure.  An abnormal heartbeat.  Some diseases, such as problems in red blood cells (anemia)orproblems in breathing (respiratory failure).  Tightening (spasm) of a blood vessel that cuts off blood to the heart.  A tear in a blood vessel of the heart.  High blood pressure. What increases the risk? The following factors may make you more likely to develop this condition:  Aging. The older you are, the higher your risk.  Having a personal or family history of chest pain, heart attack, stroke, or narrowing of the arteries in the legs, arms, head, or stomach (peripheral artery disease).  Being male.  Smoking.  Not getting regular exercise.  Being overweight or obese.  Having high blood pressure.  Having high cholesterol.  Having diabetes.  Drinking too much alcohol.  Using illegal drugs, such as cocaine or methamphetamine. What are the signs or symptoms? Symptoms of this condition include:  Chest pain. It may feel like: ? Crushing or squeezing. ? Tightness, pressure, fullness, or heaviness.  Pain in the arm, neck, jaw, back, or upper body.  Shortness of breath.  Heartburn.  Upset stomach (indigestion).  Feeling like you may vomit (nauseous).  Cold sweats.  Feeling tired.  Sudden light-headedness. How is this treated? A heart attack must be treated as soon as  possible. Treatment may include:  Medicines to: ? Break up or dissolve blood clots. ? Thin blood and help prevent blood clots. ? Treat blood pressure. ? Improve blood flow to the heart. ? Reduce pain. ? Reduce cholesterol.  Procedures to widen a blocked artery and keep it open.  Open heart surgery.  Receiving oxygen.  Making your heart strong again (cardiac rehabilitation) through exercise, education, and counseling. Follow these instructions at home: Medicines  Take over-the-counter and prescription medicines only as told by your doctor. You may need to take medicine: ? To keep your blood from clotting too easily. ? To control blood pressure. ? To lower cholesterol. ? To control heart rhythms.  Do not take these medicines unless your doctor says it is okay: ? NSAIDs, such as ibuprofen. ? Supplements that have vitamin A, vitamin E, or both. ? Hormone replacement therapy that has estrogen with or without progestin. Lifestyle      Do not use any products that have nicotine or tobacco, such as cigarettes, e-cigarettes, and chewing tobacco. If you need help quitting, ask your doctor.  Avoid secondhand smoke.  Exercise regularly. Ask your doctor about a cardiac rehab program.  Eat heart-healthy foods. Your doctor will tell you what foods to eat.  Stay at a healthy weight.  Lower your stress level.  Do not use illegal drugs. Alcohol use  Do not drink alcohol if: ? Your doctor tells you not to drink. ? You are pregnant, may be pregnant, or are planning to become pregnant.    If you drink alcohol: ? Limit how much you use to:  0-1 drink a day for women.  0-2 drinks a day for men. ? Know how much alcohol is in your drink. In the [place] one drink equals one 12 oz bottle of beer (355 mL), one 5 oz glass of wine (148 mL), or one 1 oz glass of hard liquor (44 mL). General instructions  Work with your doctor to treat other problems you may have, such as diabetes or high  blood pressure.  Get screened for depression. Get treatment if needed.  Keep your vaccines up to date. Get the flu shot (influenza vaccine) every year.  Keep all follow-up visits as told by your doctor. This is important. Contact a doctor if:  You feel very sad.  You have trouble doing your daily activities. Get help right away if:  You have sudden, unexplained discomfort in your chest, arms, back, neck, jaw, or upper body.  You have shortness of breath.  You have sudden sweating or clammy skin.  You feel like you may vomit.  You vomit.  You feel tired or weak.  You get light-headed or dizzy.  You feel your heart beating fast.  You feel your heart skipping beats.  You have blood pressure that is higher than 180/120. These symptoms may be an emergency. Do not wait to see if the symptoms will go away. Get medical help right away. Call your local emergency services (911 in the [place] Do not drive yourself to the hospital. Summary  A heart attack occurs when blood and oxygen supply to the heart is cut off.  Do not take NSAIDs unless your doctor says it is okay.  Do not smoke. Avoid secondhand smoke.  Exercise regularly. Ask your doctor about a cardiac rehab program. This information is not intended to replace advice given to you by your health care provider. Make sure you discuss any questions you have with your health care provider. Document Revised: 06/25/2018 Document Reviewed: 06/25/2018 Elsevier Patient Education  2020 [employer]  Coronary Angiogram With Stent Coronary angiogram with stent placement is a procedure to widen or open a narrow blood vessel of the heart (coronary artery). Arteries may become blocked by cholesterol buildup (plaques) in the lining of the artery wall. When a coronary artery becomes partially blocked, blood flow to that area decreases. This may lead to chest pain or a heart attack (myocardial infarction). A stent is a small piece of metal  that looks like mesh or spring. Stent placement may be done as treatment after a heart attack, or to prevent a heart attack if a blocked artery is found by a coronary angiogram. Let your health care provider know about:  Any allergies you have, including allergies to medicines or contrast dye.  All medicines you are taking, including vitamins, herbs, eye drops, creams, and over-the-counter medicines.  Any problems you or family members have had with anesthetic medicines.  Any blood disorders you have.  Any surgeries you have had.  Any medical conditions you have, including kidney problems or kidney failure.  Whether you are pregnant or may be pregnant.  Whether you are breastfeeding. What are the risks? Generally, this is a safe procedure. However, serious problems may occur, including:  Damage to nearby structures or organs, such as the heart, blood vessels, or kidneys.  A return of blockage.  Bleeding, infection, or bruising at the insertion site.  A collection of blood under the skin (hematoma) at the insertion site.  A blood clot in another part of the body.  Allergic reaction to medicines or dyes.  Bleeding into the abdomen (retroperitoneal bleeding).  Stroke (rare).  Heart attack (rare). What happens before the procedure? Staying hydrated Follow instructions from your health care provider about hydration, which may include:  Up to 2 hours before the procedure - you may continue to drink clear liquids, such as water, clear fruit juice, black coffee, and plain tea.  Eating and drinking restrictions Follow instructions from your health care provider about eating and drinking, which may include:  8 hours before the procedure - stop eating heavy meals or foods, such as meat, fried foods, or fatty foods.  6 hours before the procedure - stop eating light meals or foods, such as toast or cereal.  2 hours before the procedure - stop drinking clear  liquids. Medicines Ask your health care provider about:  Changing or stopping your regular medicines. This is especially important if you are taking diabetes medicines or blood thinners.  Taking medicines such as aspirin and ibuprofen. These medicines can thin your blood. Do not take these medicines unless your health care provider tells you to take them. ? Generally, aspirin is recommended before a thin tube, called a catheter, is passed through a blood vessel and inserted into the heart (cardiac catheterization).  Taking over-the-counter medicines, vitamins, herbs, and supplements. General instructions  Do not use any products that contain nicotine or tobacco for at least 4 weeks before the procedure. These products include cigarettes, e-cigarettes, and chewing tobacco. If you need help quitting, ask your health care provider.  Plan to have someone take you home from the hospital or clinic.  If you will be going home right after the procedure, plan to have someone with you for 24 hours.  You may have tests and imaging procedures.  Ask your health care provider: ? How your insertion site will be marked. Ask which artery will be used for the procedure. ? What steps will be taken to help prevent infection. These may include:  Removing hair at the insertion site.  Washing skin with a germ-killing soap.  Taking antibiotic medicine. What happens during the procedure?   An IV will be inserted into one of your veins.  Electrodes may be placed on your chest to monitor your heart rate during the procedure.  You will be given one or more of the following: ? A medicine to help you relax (sedative). ? A medicine to numb the area (local anesthetic) for catheter insertion.  A small incision will be made for catheter insertion.  The catheter will be inserted into an artery using a guide wire. The location may be in your groin, your wrist, or the fold of your arm (near your elbow).  An  X-ray procedure (fluoroscopy) will be used to help guide the catheter to the opening of the heart arteries.  A dye will be injected into the catheter. X-rays will be taken. The dye helps to show where any narrowing or blockages are located in the arteries.  Tell your health care provider if you have chest pain or trouble breathing.  A tiny wire will be guided to the blocked spot, and a balloon will be inflated to make the artery wider.  The stent will be expanded to crush the plaques into the wall of the vessel. The stent will hold the area open and improve the blood flow. Most stents have a drug coating to reduce the risk of the stent  narrowing over time.  The artery may be made wider using a drill, laser, or other tools that remove plaques.  The catheter will be removed when the blood flow improves. The stent will stay where it was placed, and the lining of the artery will grow over it.  A bandage (dressing) will be placed on the insertion site. Pressure will be applied to stop bleeding.  The IV will be removed. This procedure may vary among health care providers and hospitals. What happens after the procedure?  Your blood pressure, heart rate, breathing rate, and blood oxygen level will be monitored until you leave the hospital or clinic.  If the procedure is done through the leg, you will lie flat in bed for a few hours or for as long as told by your health care provider. You will be instructed not to bend or cross your legs.  The insertion site and the pulse in your foot or wrist will be checked often.  You may have more blood tests, X-rays, and a test that records the electrical activity of your heart (electrocardiogram, or ECG).  Do not drive for 24 hours if you were given a sedative during your procedure. Summary  Coronary angiogram with stent placement is a procedure to widen or open a narrowed coronary artery. This is done to treat heart problems.  Before the procedure, let  your health care provider know about all the medical conditions and surgeries you have or have had.  This is a safe procedure. However, some problems may occur, including damage to nearby structures or organs, bleeding, blood clots, or allergies.  Follow your health care provider's instructions about eating, drinking, medicines, and other lifestyle changes, such as quitting tobacco use before the procedure. This information is not intended to replace advice given to you by your health care provider. Make sure you discuss any questions you have with your health care provider. Document Revised: 10/03/2018 Document Reviewed: 10/03/2018 Elsevier Patient Education  2020 [employer].

## 2019-11-29 NOTE — Discharge Summary (Signed)
5        [place] at [facility]   PATIENT NAME: Stephen Melendez    MR#:  [id number]  DATE OF BIRTH:  [birth year]  DATE OF ADMISSION:  11/26/2019   ADMITTING PHYSICIAN: Delfino Lovett, MD  DATE OF DISCHARGE: 11/27/2019 11:40 AM  PRIMARY CARE PHYSICIAN: [name], MD   ADMISSION DIAGNOSIS:  ACS (acute coronary syndrome) (HCC) [I24.9] NSTEMI (non-ST elevated myocardial infarction) (HCC) [I21.4] Acute coronary syndromes (HCC) [I24.9] DISCHARGE DIAGNOSIS:  Principal Problem:   ACS (acute coronary syndrome) (HCC) Active Problems:   Acute coronary syndromes (HCC)   NSTEMI (non-ST elevated myocardial infarction) (HCC)  SECONDARY DIAGNOSIS:  History reviewed. No pertinent past medical history. HOSPITAL COURSE:  64 year old male with no prior cardiac history admitted for non-STEMI while playing golf.    Non-ST elevation MI s/p PCI with placement of stent. - He was placed on Brilinta which caused him severe shortness of breath so was switched over to Plavix and he has tolerated that well. He is now symptom-free being discharged on enteric-coated aspirin 81 mg p.o. daily, Plavix 75 mg p.o. daily, atorvastatin 40 mg p.o. daily, metoprolol 25 mg p.o. daily.  His LV function was normal so ACE inhibitor was not indicated.  He will benefit from  cardiac rehab as an outpatient  BPH Continue tamsulosin   DISCHARGE CONDITIONS:  Stable CONSULTS OBTAINED:  Treatment Team:  Dalia Heading, MD DRUG ALLERGIES:   Allergies  Allergen Reactions  . Brilinta [Ticagrelor] Shortness Of Breath    Per cards note on 11/27/19 by Dr. Lady Gary. Brilinta caused severe SOB  . Penicillins Swelling    Did it involve swelling of the face/tongue/throat, SOB, or low BP? Yes Did it involve sudden or severe rash/hives, skin peeling, or any reaction on the inside of your mouth or nose?   Did you need to seek medical attention at a hospital or doctor's office? No When did it last happen? If all  above answers are "NO", may proceed with cephalosporin use.  . Sulfa Antibiotics Rash    Other reaction(s): Unknown Other Reaction: ? RASH   DISCHARGE MEDICATIONS:   Allergies as of 11/27/2019      Reactions   Brilinta [ticagrelor] Shortness Of Breath   Per cards note on 11/27/19 by Dr. Lady Gary. Brilinta caused severe SOB   Penicillins Swelling   Did it involve swelling of the face/tongue/throat, SOB, or low BP? Yes Did it involve sudden or severe rash/hives, skin peeling, or any reaction on the inside of your mouth or nose?  Did you need to seek medical attention at a hospital or doctor's office? No When did it last happen? If all above answers are "NO", may proceed with cephalosporin use.   Sulfa Antibiotics Rash   Other reaction(s): Unknown Other Reaction: ? RASH      Medication List    STOP taking these medications   doxycycline 100 MG tablet Commonly known as: VIBRA-TABS     TAKE these medications   ascorbic acid 1000 MG tablet Commonly known as: VITAMIN C Take 3,000 mg by mouth daily.   aspirin EC 81 MG tablet Take 81 mg by mouth daily.   atorvastatin 40 MG tablet Commonly known as: LIPITOR Take 1 tablet (40 mg total) by mouth daily.   Cholecalciferol 50 MCG (2000 UT) Caps Take 1 capsule by mouth daily.   clopidogrel 75 MG tablet Commonly known as: PLAVIX Take 1 tablet (75 mg total) by mouth daily.   ferrous sulfate 325 (  65 FE) MG tablet Take 325 mg by mouth daily with breakfast.   Fish Oil 1000 MG Caps Take 1 capsule by mouth daily.   metoprolol tartrate 25 MG tablet Commonly known as: LOPRESSOR Take 1 tablet (25 mg total) by mouth daily.   tamsulosin 0.4 MG Caps capsule Commonly known as: FLOMAX Take 0.4 mg by mouth daily after supper.   vitamin B-12 500 MCG tablet Commonly known as: CYANOCOBALAMIN Take 500 mcg by mouth daily.      DISCHARGE INSTRUCTIONS:   DIET:  Cardiac diet DISCHARGE CONDITION:  Stable ACTIVITY:  Activity as  tolerated OXYGEN:  Home Oxygen: No.  Oxygen Delivery: room air DISCHARGE LOCATION:  home   If you experience worsening of your admission symptoms, develop shortness of breath, life threatening emergency, suicidal or homicidal thoughts you must seek medical attention immediately by calling 911 or calling your MD immediately  if symptoms less severe.  You Must read complete instructions/literature along with all the possible adverse reactions/side effects for all the Medicines you take and that have been prescribed to you. Take any new Medicines after you have completely understood and accpet all the possible adverse reactions/side effects.   Please note  You were cared for by a hospitalist during your hospital stay. If you have any questions about your discharge medications or the care you received while you were in the hospital after you are discharged, you can call the unit and asked to speak with the hospitalist on call if the hospitalist that took care of you is not available. Once you are discharged, your primary care physician will handle any further medical issues. Please note that NO REFILLS for any discharge medications will be authorized once you are discharged, as it is imperative that you return to your primary care physician (or establish a relationship with a primary care physician if you do not have one) for your aftercare needs so that they can reassess your need for medications and monitor your lab values.    On the day of Discharge:  VITAL SIGNS:  Blood pressure 134/72, pulse (!) 54, temperature 98.4 F (36.9 C), temperature source Oral, resp. rate 16, height 6' (1.829 m), weight 89.4 kg, SpO2 99 %. PHYSICAL EXAMINATION:  GENERAL:  64 y.o.-year-old patient lying in the bed with no acute distress.  EYES: Pupils equal, round, reactive to light and accommodation. No scleral icterus. Extraocular muscles intact.  HEENT: Head atraumatic, normocephalic. Oropharynx and nasopharynx  clear.  NECK:  Supple, no jugular venous distention. No thyroid enlargement, no tenderness.  LUNGS: Normal breath sounds bilaterally, no wheezing, rales,rhonchi or crepitation. No use of accessory muscles of respiration.  CARDIOVASCULAR: S1, S2 normal. No murmurs, rubs, or gallops.  ABDOMEN: Soft, non-tender, non-distended. Bowel sounds present. No organomegaly or mass.  EXTREMITIES: No pedal edema, cyanosis, or clubbing.  NEUROLOGIC: Cranial nerves II through XII are intact. Muscle strength 5/5 in all extremities. Sensation intact. Gait not checked.  PSYCHIATRIC: The patient is alert and oriented x 3.  SKIN: No obvious rash, lesion, or ulcer.  DATA REVIEW:   CBC Recent Labs  Lab 11/27/19 0455  WBC 9.0  HGB 14.0  HCT 42.1  PLT 206    Chemistries  Recent Labs  Lab 11/27/19 0455  NA 138  K 4.3  CL 108  CO2 23  GLUCOSE 91  BUN 24*  CREATININE 1.27*  CALCIUM 8.6*     Outpatient follow-up  Follow-up Information    Dalia Heading, MD On 12/05/2019.  Specialty: Cardiology Why: Appointment at [facility] information: [address] [place] [address] [phone]        [name], MD. Schedule an appointment as soon as possible for a visit in 2 weeks.   Specialty: Family Medicine Why: Patient will need to make an follow up appointment. Contact information: [address] [facility] [place] [address] [phone]                Management plans discussed with the patient, family and they are in agreement.  CODE STATUS: Prior   TOTAL TIME TAKING CARE OF THIS PATIENT: 45 minutes.    Delfino Lovett M.D on 11/29/2019 at 9:27 PM  [facility]   CC: Primary care physician; [name], MD   Note: This dictation was prepared with Dragon dictation along with smaller phrase technology. Any transcriptional errors that result from this process are unintentional.

## 2020-02-13 ENCOUNTER — Other Ambulatory Visit: Payer: Self-pay

## 2020-02-13 ENCOUNTER — Emergency Department: Payer: PRIVATE HEALTH INSURANCE

## 2020-02-13 ENCOUNTER — Emergency Department
Admission: EM | Admit: 2020-02-13 | Discharge: 2020-02-13 | Disposition: A | Discharge status: Home / Self Care | Payer: PRIVATE HEALTH INSURANCE | Attending: Emergency Medicine | Admitting: Emergency Medicine

## 2020-02-13 DIAGNOSIS — Z955 Presence of coronary angioplasty implant and graft: Secondary | ICD-10-CM | POA: Diagnosis not present

## 2020-02-13 DIAGNOSIS — I2510 Atherosclerotic heart disease of native coronary artery without angina pectoris: Secondary | ICD-10-CM | POA: Insufficient documentation

## 2020-02-13 DIAGNOSIS — Z7982 Long term (current) use of aspirin: Secondary | ICD-10-CM | POA: Insufficient documentation

## 2020-02-13 DIAGNOSIS — R0602 Shortness of breath: Secondary | ICD-10-CM | POA: Diagnosis present

## 2020-02-13 DIAGNOSIS — Z20822 Contact with and (suspected) exposure to covid-19: Secondary | ICD-10-CM | POA: Insufficient documentation

## 2020-02-13 DIAGNOSIS — J189 Pneumonia, unspecified organism: Secondary | ICD-10-CM | POA: Diagnosis not present

## 2020-02-13 LAB — COMPREHENSIVE METABOLIC PANEL
ALT: 40 U/L (ref 0–44)
AST: 32 U/L (ref 15–41)
Albumin: 4.1 g/dL (ref 3.5–5.0)
Alkaline Phosphatase: 106 U/L (ref 38–126)
Anion gap: 8 (ref 5–15)
BUN: 25 mg/dL — ABNORMAL HIGH (ref 8–23)
CO2: 25 mmol/L (ref 22–32)
Calcium: 9 mg/dL (ref 8.9–10.3)
Chloride: 106 mmol/L (ref 98–111)
Creatinine, Ser: 1.3 mg/dL — ABNORMAL HIGH (ref 0.61–1.24)
GFR, Estimated: >60 mL/min (ref >60)
Glucose, Bld: 100 mg/dL — ABNORMAL HIGH (ref 70–99)
Potassium: 4 mmol/L (ref 3.5–5.1)
Sodium: 139 mmol/L (ref 135–145)
Total Bilirubin: 0.8 mg/dL (ref 0.3–1.2)
Total Protein: 7.1 g/dL (ref 6.5–8.1)

## 2020-02-13 LAB — CBC WITH DIFFERENTIAL/PLATELET
Abs Immature Granulocytes: 0.04 10*3/uL (ref 0.00–0.07)
Basophils Absolute: 0 10*3/uL (ref 0.0–0.1)
Basophils Relative: 1 %
Eosinophils Absolute: 0.3 10*3/uL (ref 0.0–0.5)
Eosinophils Relative: 4 %
HCT: 42.2 % (ref 39.0–52.0)
Hemoglobin: 13.9 g/dL (ref 13.0–17.0)
Immature Granulocytes: 1 %
Lymphocytes Relative: 24 %
Lymphs Abs: 1.8 10*3/uL (ref 0.7–4.0)
MCH: 28.3 pg (ref 26.0–34.0)
MCHC: 32.9 g/dL (ref 30.0–36.0)
MCV: 85.9 fL (ref 80.0–100.0)
Monocytes Absolute: 0.9 10*3/uL (ref 0.1–1.0)
Monocytes Relative: 12 %
Neutro Abs: 4.3 10*3/uL (ref 1.7–7.7)
Neutrophils Relative %: 58 %
Platelets: 214 10*3/uL (ref 150–400)
RBC: 4.91 MIL/uL (ref 4.22–5.81)
RDW: 14 % (ref 11.5–15.5)
WBC: 7.3 10*3/uL (ref 4.0–10.5)
nRBC: 0 % (ref 0.0–0.2)

## 2020-02-13 LAB — TROPONIN I (HIGH SENSITIVITY)
Troponin I (High Sensitivity): 11 ng/L (ref <18)
Troponin I (High Sensitivity): 10 ng/L (ref <18)

## 2020-02-13 LAB — RESPIRATORY PANEL BY RT PCR (FLU A&B, COVID)
Influenza A by PCR: NEGATIVE
Influenza B by PCR: NEGATIVE
SARS Coronavirus 2 by RT PCR: NEGATIVE

## 2020-02-13 LAB — BRAIN NATRIURETIC PEPTIDE: B Natriuretic Peptide: 72.7 pg/mL (ref 0.0–100.0)

## 2020-02-13 MED ORDER — PREDNISONE 20 MG PO TABS: 60.0000 mg | ORAL_TABLET | Freq: Every day | ORAL | 0 refills | Status: AC

## 2020-02-13 MED ORDER — DOXYCYCLINE HYCLATE 100 MG PO CAPS: 100.0000 mg | ORAL_CAPSULE | Freq: Two times a day (BID) | ORAL | 0 refills | Status: AC

## 2020-02-13 MED ORDER — ALBUTEROL SULFATE HFA 108 (90 BASE) MCG/ACT IN AERS: 2.0000 | INHALATION_SPRAY | Freq: Four times a day (QID) | RESPIRATORY_TRACT | 2 refills | Status: AC | PRN

## 2020-02-13 MED ORDER — IPRATROPIUM-ALBUTEROL 0.5-2.5 (3) MG/3ML IN SOLN
3.0000 mL | Freq: Once | RESPIRATORY_TRACT | Status: AC
  Administered 2020-02-13: 3 mL via RESPIRATORY_TRACT
  Filled 2020-02-13: qty 6

## 2020-02-13 MED ORDER — IOHEXOL 350 MG/ML SOLN
75.0000 mL | Freq: Once | INTRAVENOUS | Status: AC | PRN
  Administered 2020-02-13: 75 mL via INTRAVENOUS

## 2020-02-13 MED ORDER — DOXYCYCLINE HYCLATE 100 MG PO TABS
100.0000 mg | ORAL_TABLET | Freq: Once | ORAL | Status: AC
  Administered 2020-02-13: 100 mg via ORAL
  Filled 2020-02-13: qty 1

## 2020-02-13 MED ORDER — CEFPODOXIME PROXETIL 200 MG PO TABS: 200.0000 mg | ORAL_TABLET | Freq: Two times a day (BID) | ORAL | 0 refills | Status: AC

## 2020-02-13 MED ORDER — SODIUM CHLORIDE 0.9 % IV SOLN
1.0000 g | Freq: Once | INTRAVENOUS | Status: AC
  Administered 2020-02-13: 1 g via INTRAVENOUS
  Filled 2020-02-13: qty 10

## 2020-02-13 MED ORDER — IPRATROPIUM-ALBUTEROL 0.5-2.5 (3) MG/3ML IN SOLN
3.0000 mL | Freq: Once | RESPIRATORY_TRACT | Status: AC
  Administered 2020-02-13: 3 mL via RESPIRATORY_TRACT

## 2020-02-13 NOTE — ED Triage Notes (Signed)
Patient c/o hemoptysis and orthopnea beginning today.

## 2020-02-13 NOTE — ED Notes (Signed)
Patient transported to X-ray 

## 2020-02-13 NOTE — Discharge Instructions (Signed)
Use your inhaler when you feel short of breath.  2 puffs every 4-6 hours as needed.  Take 16 mg of prednisone once a day for the next 4 days.  Take your antibiotics fully for 7 days.  Follow-up with your primary care doctor peer return to the emergency room for chest pain, fever, shortness of breath.

## 2020-02-13 NOTE — ED Provider Notes (Signed)
[facility] Emergency Department Provider Note  ____________________________________________  Time seen: Approximately 4:17 AM  I have reviewed the triage vital signs and the nursing notes.   HISTORY  Chief Complaint Shortness of Breath and Hemoptysis   HPI Stephen Melendez is a 64 y.o. male with a history of CAD status post NSTEMI and stent in September 2021 on Plavix and aspirin and smoking who presents for evaluation of hemoptysis and orthopnea.  Patient reports being in his usual state of health today.  Work in the yard for several hours.  He was feeling well until dinnertime.  Patient had a [employer] meal.  After that patient started having cough productive of clear phlegm.  When he tried to lay down to go to sleep he felt that he was choking and he could not breathe.  Difficulty breathing proved when he sat up.  He kept coughing clear phlegm and eventually noticed some blood in the phlegm.  Reports 1 small blood clot.  He denies any chest pain or fever.  No personal or family history of PE or DVT, no recent travel or, no leg pain or swelling.  He is on Plavix.  History reviewed. No pertinent past medical history.  Patient Active Problem List   Diagnosis Date Noted  . Acute coronary syndromes (HCC) 11/27/2019  . NSTEMI (non-ST elevated myocardial infarction) (HCC)   . ACS (acute coronary syndrome) (HCC) 11/26/2019    Past Surgical History:  Procedure Laterality Date  . BACK SURGERY    . CORONARY STENT INTERVENTION N/A 11/26/2019   Procedure: CORONARY STENT INTERVENTION;  Surgeon: Alwyn Pea, MD;  Location: [facility] INVASIVE CV LAB;  Service: Cardiovascular;  Laterality: N/A;  LPLA & CFX  . LEFT HEART CATH AND CORONARY ANGIOGRAPHY N/A 11/26/2019   Procedure: LEFT HEART CATH AND CORONARY ANGIOGRAPHY;  Surgeon: Dalia Heading, MD;  Location: [facility] INVASIVE CV LAB;  Service: Cardiovascular;  Laterality: N/A;    Prior to Admission medications   Medication  Sig Start Date End Date Taking? Authorizing Provider  albuterol (VENTOLIN HFA) 108 (90 Base) MCG/ACT inhaler Inhale 2 puffs into the lungs every 6 (six) hours as needed for wheezing or shortness of breath. 02/13/20   Nita Sickle, MD  ascorbic acid (VITAMIN C) 1000 MG tablet Take 3,000 mg by mouth daily.     [provider]  aspirin EC 81 MG tablet Take 81 mg by mouth daily.    [provider]  atorvastatin (LIPITOR) 40 MG tablet Take 1 tablet (40 mg total) by mouth daily. 11/27/19   Delfino Lovett, MD  cefpodoxime (VANTIN) 200 MG tablet Take 1 tablet (200 mg total) by mouth 2 (two) times daily for 7 days. 02/13/20 02/20/20  Nita Sickle, MD  Cholecalciferol 50 MCG (2000 UT) CAPS Take 1 capsule by mouth daily.     [provider]  clopidogrel (PLAVIX) 75 MG tablet Take 1 tablet (75 mg total) by mouth daily. 11/28/19   Delfino Lovett, MD  doxycycline (VIBRAMYCIN) 100 MG capsule Take 1 capsule (100 mg total) by mouth 2 (two) times daily for 7 days. 02/13/20 02/20/20  Nita Sickle, MD  ferrous sulfate 325 (65 FE) MG tablet Take 325 mg by mouth daily with breakfast.     [provider]  metoprolol tartrate (LOPRESSOR) 25 MG tablet Take 1 tablet (25 mg total) by mouth daily. 11/27/19 12/27/19  Delfino Lovett, MD  Omega-3 Fatty Acids (FISH OIL) 1000 MG CAPS Take 1 capsule by mouth daily.  [provider]  predniSONE (DELTASONE) 20 MG tablet Take 3 tablets (60 mg total) by mouth daily for 4 days. 02/13/20 02/17/20  Nita Sickle, MD  tamsulosin (FLOMAX) 0.4 MG CAPS capsule Take 0.4 mg by mouth daily after supper.    [provider]  vitamin B-12 (CYANOCOBALAMIN) 500 MCG tablet Take 500 mcg by mouth daily.    [provider]    Allergies Brilinta [ticagrelor], Penicillins, and Sulfa antibiotics  No family history on file.  Social History Social History   Tobacco Use  . Smoking status: Never Smoker  . Smokeless tobacco: Never  Used  Vaping Use  . Vaping Use: Never used  Substance Use Topics  . Alcohol use: Yes    Comment: socially  . Drug use: Not Currently    Review of Systems  Constitutional: Negative for fever. Eyes: Negative for visual changes. ENT: Negative for sore throat. Neck: No neck pain  Cardiovascular: Negative for chest pain. Respiratory: + shortness of breath, hemoptysis Gastrointestinal: Negative for abdominal pain, vomiting or diarrhea. Genitourinary: Negative for dysuria. Musculoskeletal: Negative for back pain. Skin: Negative for rash. Neurological: Negative for headaches, weakness or numbness. Psych: No SI or HI  ____________________________________________   PHYSICAL EXAM:  VITAL SIGNS: Vitals:   02/13/20 0400 02/13/20 0430  BP: (!) 153/75 (!) 148/78  Pulse: (!) 58 (!) 58  Resp: 17 15  Temp:    SpO2: 94% 92%    Constitutional: Alert and oriented. Well appearing and in no apparent distress. HEENT:      Head: Normocephalic and atraumatic.         Eyes: Conjunctivae are normal. Sclera is non-icteric.       Mouth/Throat: Mucous membranes are moist.       Neck: Supple with no signs of meningismus. Cardiovascular: Regular rate and rhythm. No murmurs, gallops, or rubs. 2+ symmetrical distal pulses are present in all extremities. No JVD. Respiratory: Normal respiratory effort.  Normal sats with severely diminished air movement bilaterally.  No crackles or wheezes Gastrointestinal: Soft, non tender. Musculoskeletal:  No edema, cyanosis, or erythema of extremities. Neurologic: Normal speech and language. Face is symmetric. Moving all extremities. No gross focal neurologic deficits are appreciated. Skin: Skin is warm, dry and intact. No rash noted. Psychiatric: Mood and affect are normal. Speech and behavior are normal.  ____________________________________________   LABS (all labs ordered are listed, but only abnormal results are displayed)  Labs Reviewed  COMPREHENSIVE  METABOLIC PANEL - Abnormal; Notable for the following components:      Result Value   Glucose, Bld 100 (*)    BUN 25 (*)    Creatinine, Ser 1.30 (*)    All other components within normal limits  RESPIRATORY PANEL BY RT PCR (FLU A&B, COVID)  BRAIN NATRIURETIC PEPTIDE  CBC WITH DIFFERENTIAL/PLATELET  TROPONIN I (HIGH SENSITIVITY)  TROPONIN I (HIGH SENSITIVITY)   ____________________________________________  EKG  ED ECG REPORT I, Nita Sickle, the attending physician, personally viewed and interpreted this ECG.  Sinus bradycardia, rate of 57, normal intervals, normal axis, no ST elevations or depressions. ____________________________________________  RADIOLOGY  I have personally reviewed the images performed during this visit and I agree with the Radiologist's read.   Interpretation by Radiologist:  DG Chest 2 View  Result Date: 02/13/2020 CLINICAL DATA:  64 year old male with shortness of breath, hemoptysis. Smoker. EXAM: CHEST - 2 VIEW COMPARISON:  Chest radiographs 11/26/2019. FINDINGS: Lung volumes and mediastinal contours are stable. Lung volumes are at the upper limits of normal.  Visualized tracheal air column is within normal limits. Stable increased interstitial lung markings bilaterally with no pneumothorax, pulmonary edema, pleural effusion or confluent pulmonary opacity. No acute osseous abnormality identified. Negative visible bowel gas pattern. IMPRESSION: Stable.  No acute cardiopulmonary abnormality. Electronically Signed   By: Odessa FlemingH  Hall M.D.   On: 02/13/2020 03:17   CT Angio Chest PE W and/or Wo Contrast  Result Date: 02/13/2020 CLINICAL DATA:  Hemoptysis and orthopnea beginning today. EXAM: CT ANGIOGRAPHY CHEST WITH CONTRAST TECHNIQUE: Multidetector CT imaging of the chest was performed using the standard protocol during bolus administration of intravenous contrast. Multiplanar CT image reconstructions and MIPs were obtained to evaluate the vascular anatomy.  CONTRAST:  75mL OMNIPAQUE IOHEXOL 350 MG/ML SOLN COMPARISON:  Chest x-ray from earlier today FINDINGS: Cardiovascular: Satisfactory opacification of the pulmonary arteries to the segmental level. No evidence of pulmonary embolism. Normal heart size. No pericardial effusion. Left circumflex stenting/calcification. Mediastinum/Nodes: No adenopathy or mass. Lungs/Pleura: Patchy ground-glass density in the right upper lobe with an inflammatory appearance. There is ground-glass and streaky density at both lung bases. No effusion or pneumothorax. Centrilobular emphysema Upper Abdomen: Negative Musculoskeletal: No acute or aggressive finding Review of the MIP images confirms the above findings. IMPRESSION: 1. Mild airspace disease in the right upper lobe with inflammatory appearance. 2. Bilateral lower lobe atelectasis. 3. History of orthopnea.  No pulmonary edema. 4. Emphysema (ICD10-J43.9). Electronically Signed   By: Marnee SpringJonathon  Watts M.D.   On: 02/13/2020 05:07     ____________________________________________   PROCEDURES  Procedure(s) performed:yes .1-3 Lead EKG Interpretation Performed by: Nita SickleVeronese, [place], MD Authorized by: Nita SickleVeronese, [place], MD     Interpretation: non-specific     ECG rate assessment: bradycardic     Rhythm: sinus bradycardia     Critical Care performed: yes  CRITICAL CARE Performed by: Nita Sicklearolina Amori Cooperman  ?  Total critical care time: 30 min  Critical care time was exclusive of separately billable procedures and treating other patients.  Critical care was necessary to treat or prevent imminent or life-threatening deterioration.  Critical care was time spent personally by me on the following activities: development of treatment plan with patient and/or surrogate as well as nursing, discussions with consultants, evaluation of patient's response to treatment, examination of patient, obtaining history from patient or surrogate, ordering and performing treatments and  interventions, ordering and review of laboratory studies, ordering and review of radiographic studies, pulse oximetry and re-evaluation of patient's condition.  ____________________________________________   INITIAL IMPRESSION / ASSESSMENT AND PLAN / ED COURSE   64 y.o. male with a history of CAD status post NSTEMI and stent in September 2021 on Plavix and aspirin and smoking who presents for evaluation of hemoptysis and orthopnea that started this evening.  Patient is well-appearing, hemodynamically stable, normal work of breathing and normal sats, does have decreased air movement bilaterally with no wheezing or crackles.  No diagnosis of COPD but patient is chronic smoker and has been for several years. EKG with no acute ischemic changes.  Ddx bronchitis versus pneumonia versus PE versus malignancy  CT angio visualized by me consistent with pneumonia, confirmed by radiology.  Covid and flu negative.  No signs of PE.  Patient received 2 DuoNeb's and steroids and feels markedly improved.  Able to lay flat with no shortness of breath.  Moving good air with no wheezing.  Normal work of breathing and normal sats.  No signs of sepsis with no fever no leukocytosis.  Plan to discharge home on Cefpodoxime, doxycycline, prednisone,  and albuterol.  Recommended close follow-up with primary care doctor.  Patient with no further episodes of hemoptysis in the emergency department.  Discussed my standard return precautions.  Old medical records reviewed.      _____________________________________________ Please note:  Patient was evaluated in Emergency Department today for the symptoms described in the history of present illness. Patient was evaluated in the context of the global COVID-19 pandemic, which necessitated consideration that the patient might be at risk for infection with the SARS-CoV-2 virus that causes COVID-19. Institutional protocols and algorithms that pertain to the evaluation of patients at risk  for COVID-19 are in a state of rapid change based on information released by regulatory bodies including the CDC and federal and state organizations. These policies and algorithms were followed during the patient's care in the ED.  Some ED evaluations and interventions may be delayed as a result of limited staffing during the pandemic.   [place] Controlled Substance Database was reviewed by me. ____________________________________________   FINAL CLINICAL IMPRESSION(S) / ED DIAGNOSES   Final diagnoses:  Community acquired pneumonia, unspecified laterality      NEW MEDICATIONS STARTED DURING THIS VISIT:  ED Discharge Orders         Ordered    predniSONE (DELTASONE) 20 MG tablet  Daily        02/13/20 0605    doxycycline (VIBRAMYCIN) 100 MG capsule  2 times daily        02/13/20 0605    cefpodoxime (VANTIN) 200 MG tablet  2 times daily        02/13/20 0605    albuterol (VENTOLIN HFA) 108 (90 Base) MCG/ACT inhaler  Every 6 hours PRN        02/13/20 0605           Note:  This document was prepared using Dragon voice recognition software and may include unintentional dictation errors.    Nita Sickle, MD 02/13/20 [phone]

## 2022-03-13 IMAGING — CT CT ANGIO CHEST
2 of 6 series · 18 of 46 positions shown · IV contrast (APPLIED)
Comparison: Chest x-ray from earlier today

CLINICAL DATA: Hemoptysis and orthopnea beginning today.

EXAM:
CT ANGIOGRAPHY CHEST WITH CONTRAST
TECHNIQUE: Multidetector CT imaging of the chest was performed using the
standard protocol during bolus administration of intravenous
contrast. Multiplanar CT image reconstructions and MIPs were
obtained to evaluate the vascular anatomy.
CONTRAST:  75mL OMNIPAQUE IOHEXOL 350 MG/ML SOLN

[Series 5: thins · axial · 0.78mm/px · z∈[-663,-369]mm · 15 of 324 slices shown]
[im 15/324  lung]
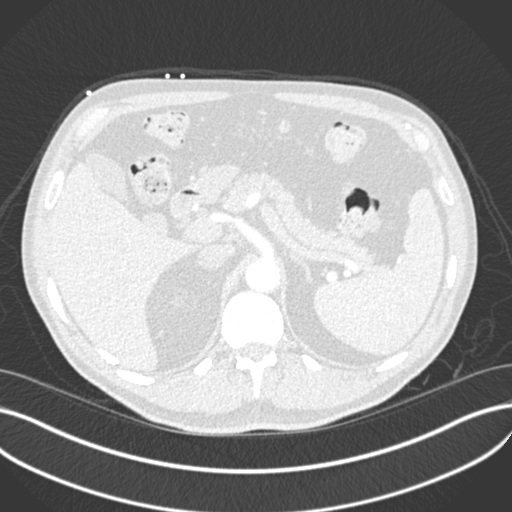
[im 43/324  soft-tissue]
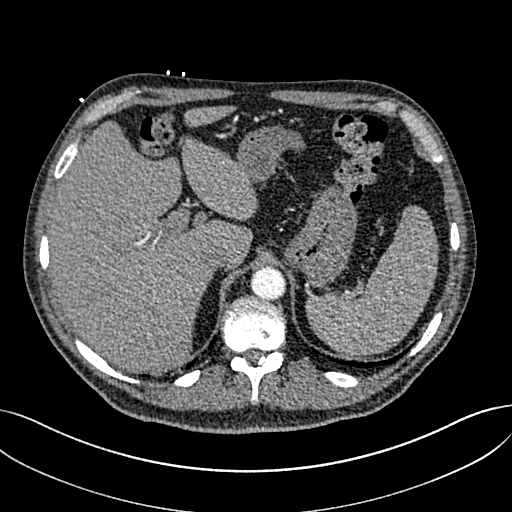
[im 57/324  lung]
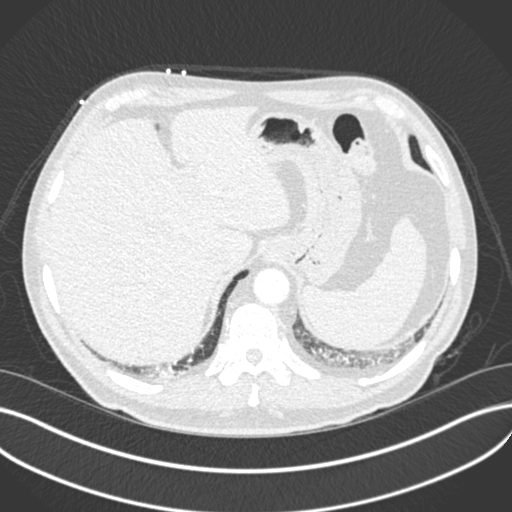
[im 85/324  soft-tissue]
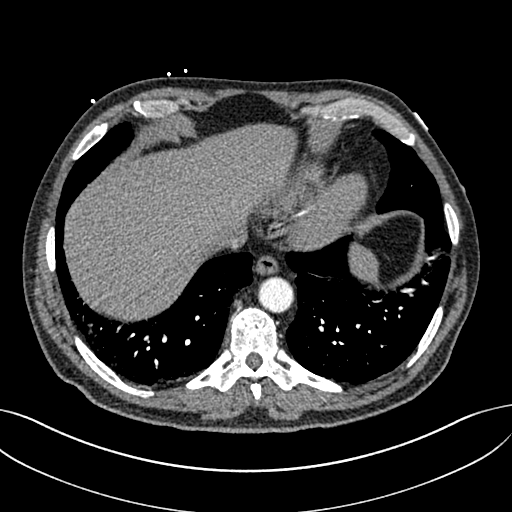
[im 99/324  lung]
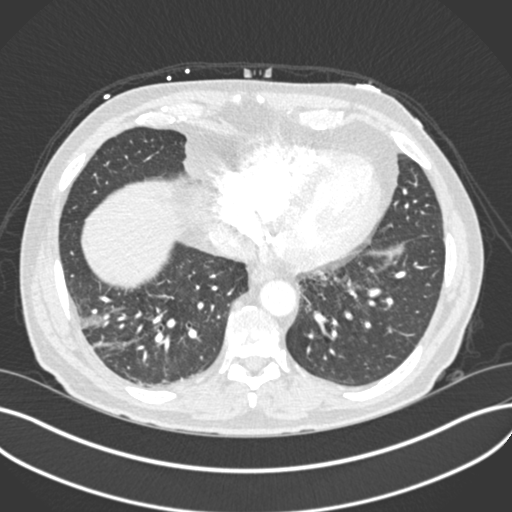
[im 127/324  soft-tissue]
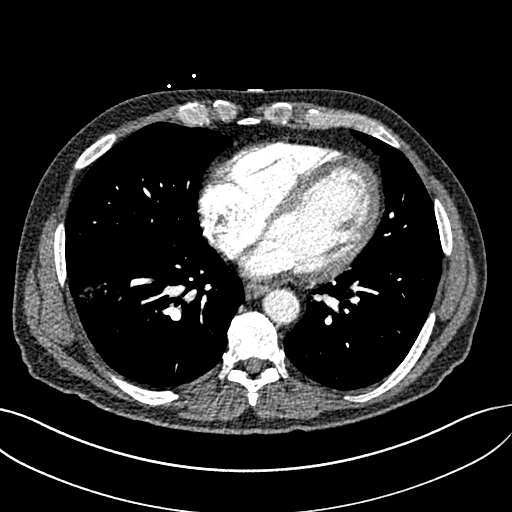
[im 141/324  lung]
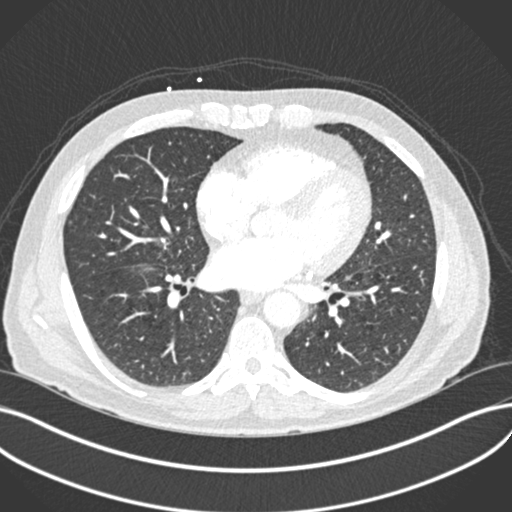
[im 169/324  soft-tissue]
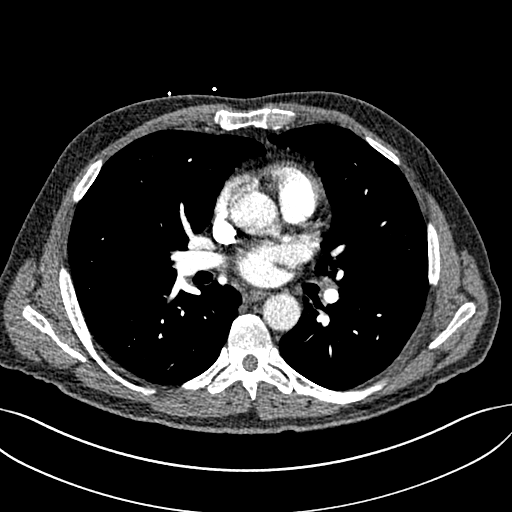
[im 183/324  lung]
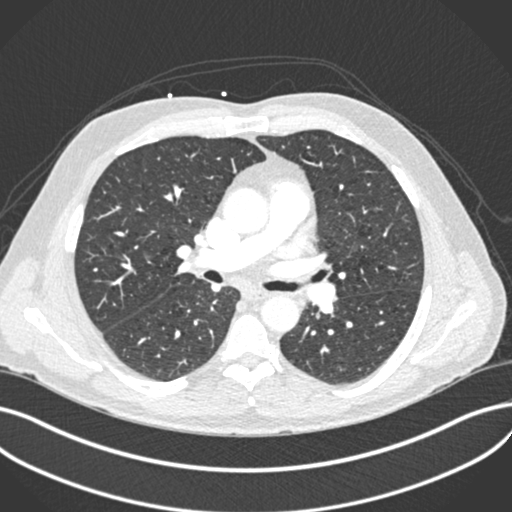
[im 197/324  soft-tissue]
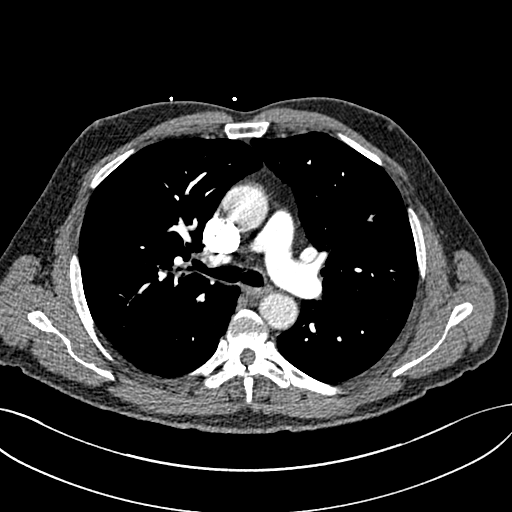
[im 225/324  lung]
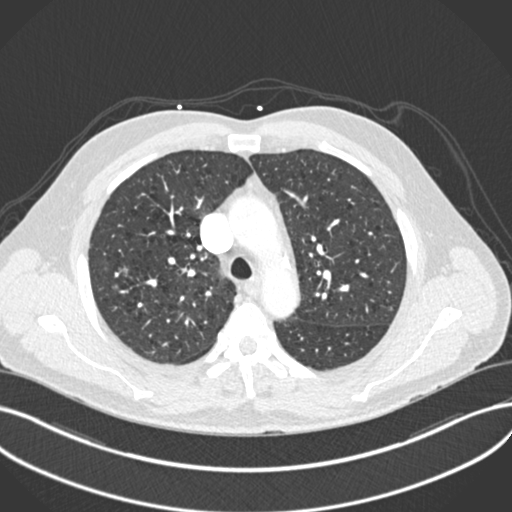
[im 239/324  soft-tissue]
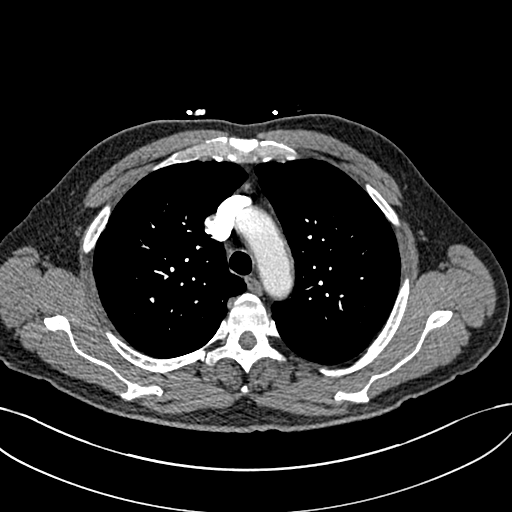
[im 267/324  lung]
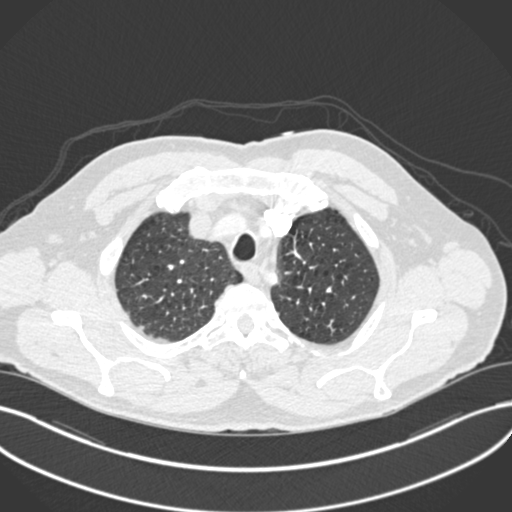
[im 281/324  soft-tissue]
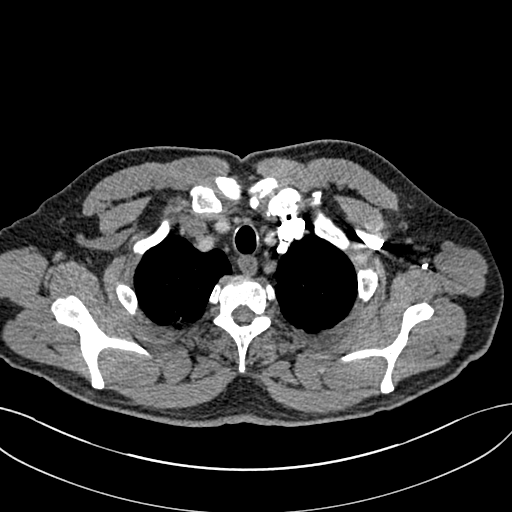
[im 309/324  lung]
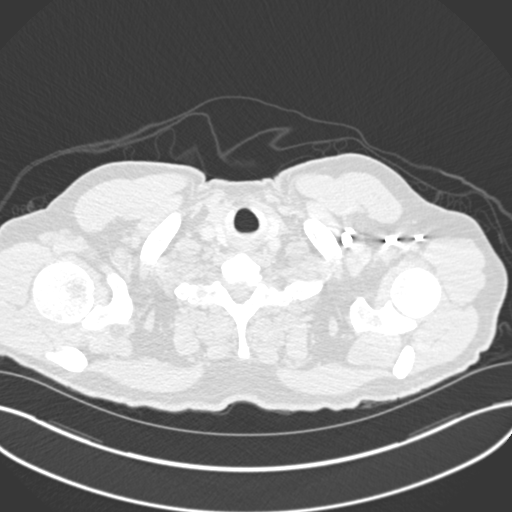

[Series 7: coronal mpr · coronal · 0.63mm/px · 3 of 96 slices shown]
[im 24/96  soft-tissue]
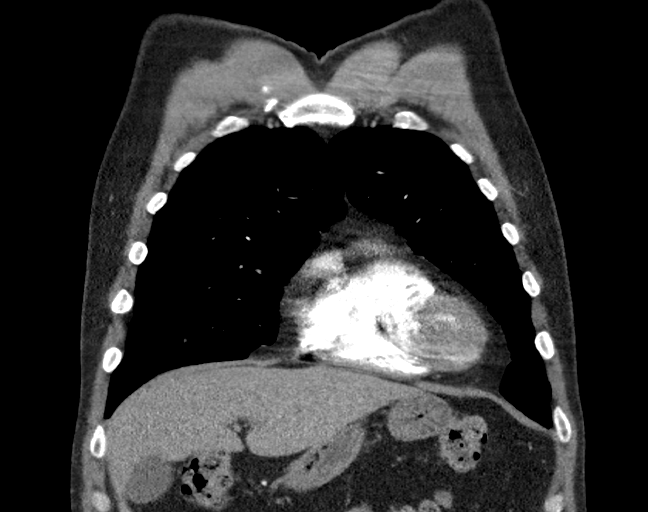
[im 48/96  soft-tissue]
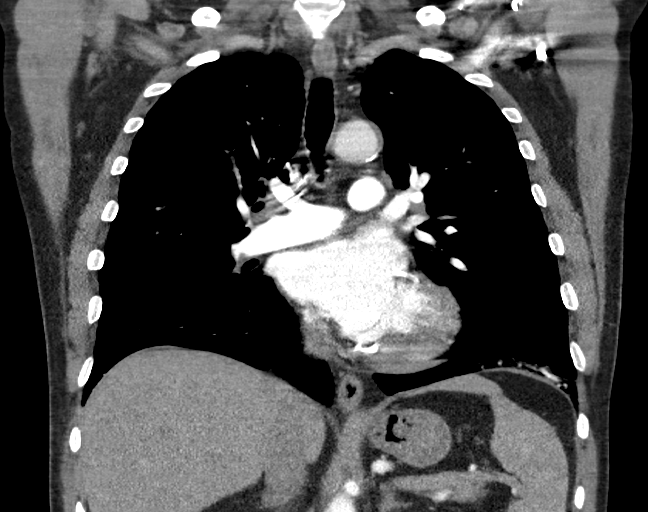
[im 72/96  soft-tissue]
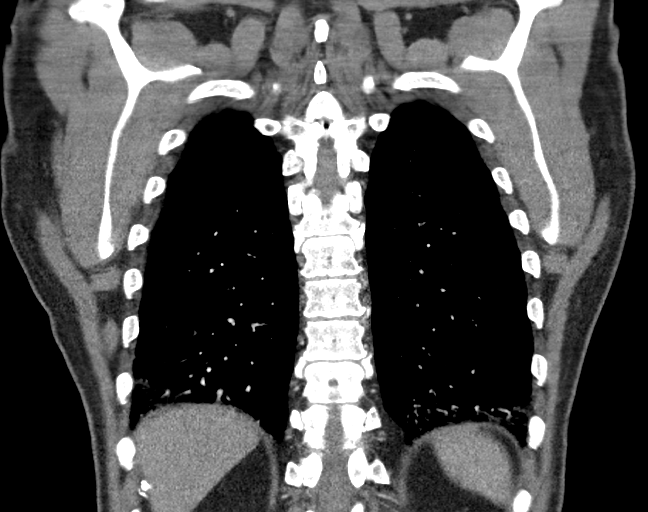

[18 of 46 positions shown; findings below may reference images not displayed]

FINDINGS: Cardiovascular: Satisfactory opacification of the pulmonary arteries
to the segmental level. No evidence of pulmonary embolism. Normal
heart size. No pericardial effusion. Left circumflex
stenting/calcification.

Mediastinum/Nodes: No adenopathy or mass.

Lungs/Pleura: Patchy ground-glass density in the right upper lobe
with an inflammatory appearance. There is ground-glass and streaky
density at both lung bases. No effusion or pneumothorax.
Centrilobular emphysema

Upper Abdomen: Negative

Musculoskeletal: No acute or aggressive finding

Review of the MIP images confirms the above findings.
IMPRESSION: 1. Mild airspace disease in the right upper lobe with inflammatory
appearance.
2. Bilateral lower lobe atelectasis.
3. History of orthopnea.  No pulmonary edema.
4. Emphysema ([id number]).

## 2022-03-13 IMAGING — CR DG CHEST 2V
1 series · 2 of 2 positions shown · non-contrast
Comparison: Chest radiographs 11/26/2019.

CLINICAL DATA: 64-year-old male with shortness of breath,
hemoptysis. Smoker.

EXAM:
CHEST - 2 VIEW

[Series 1: dg chest 2 view · 0.14mm/px · 2 of 2 slices shown]
[im 1/2]
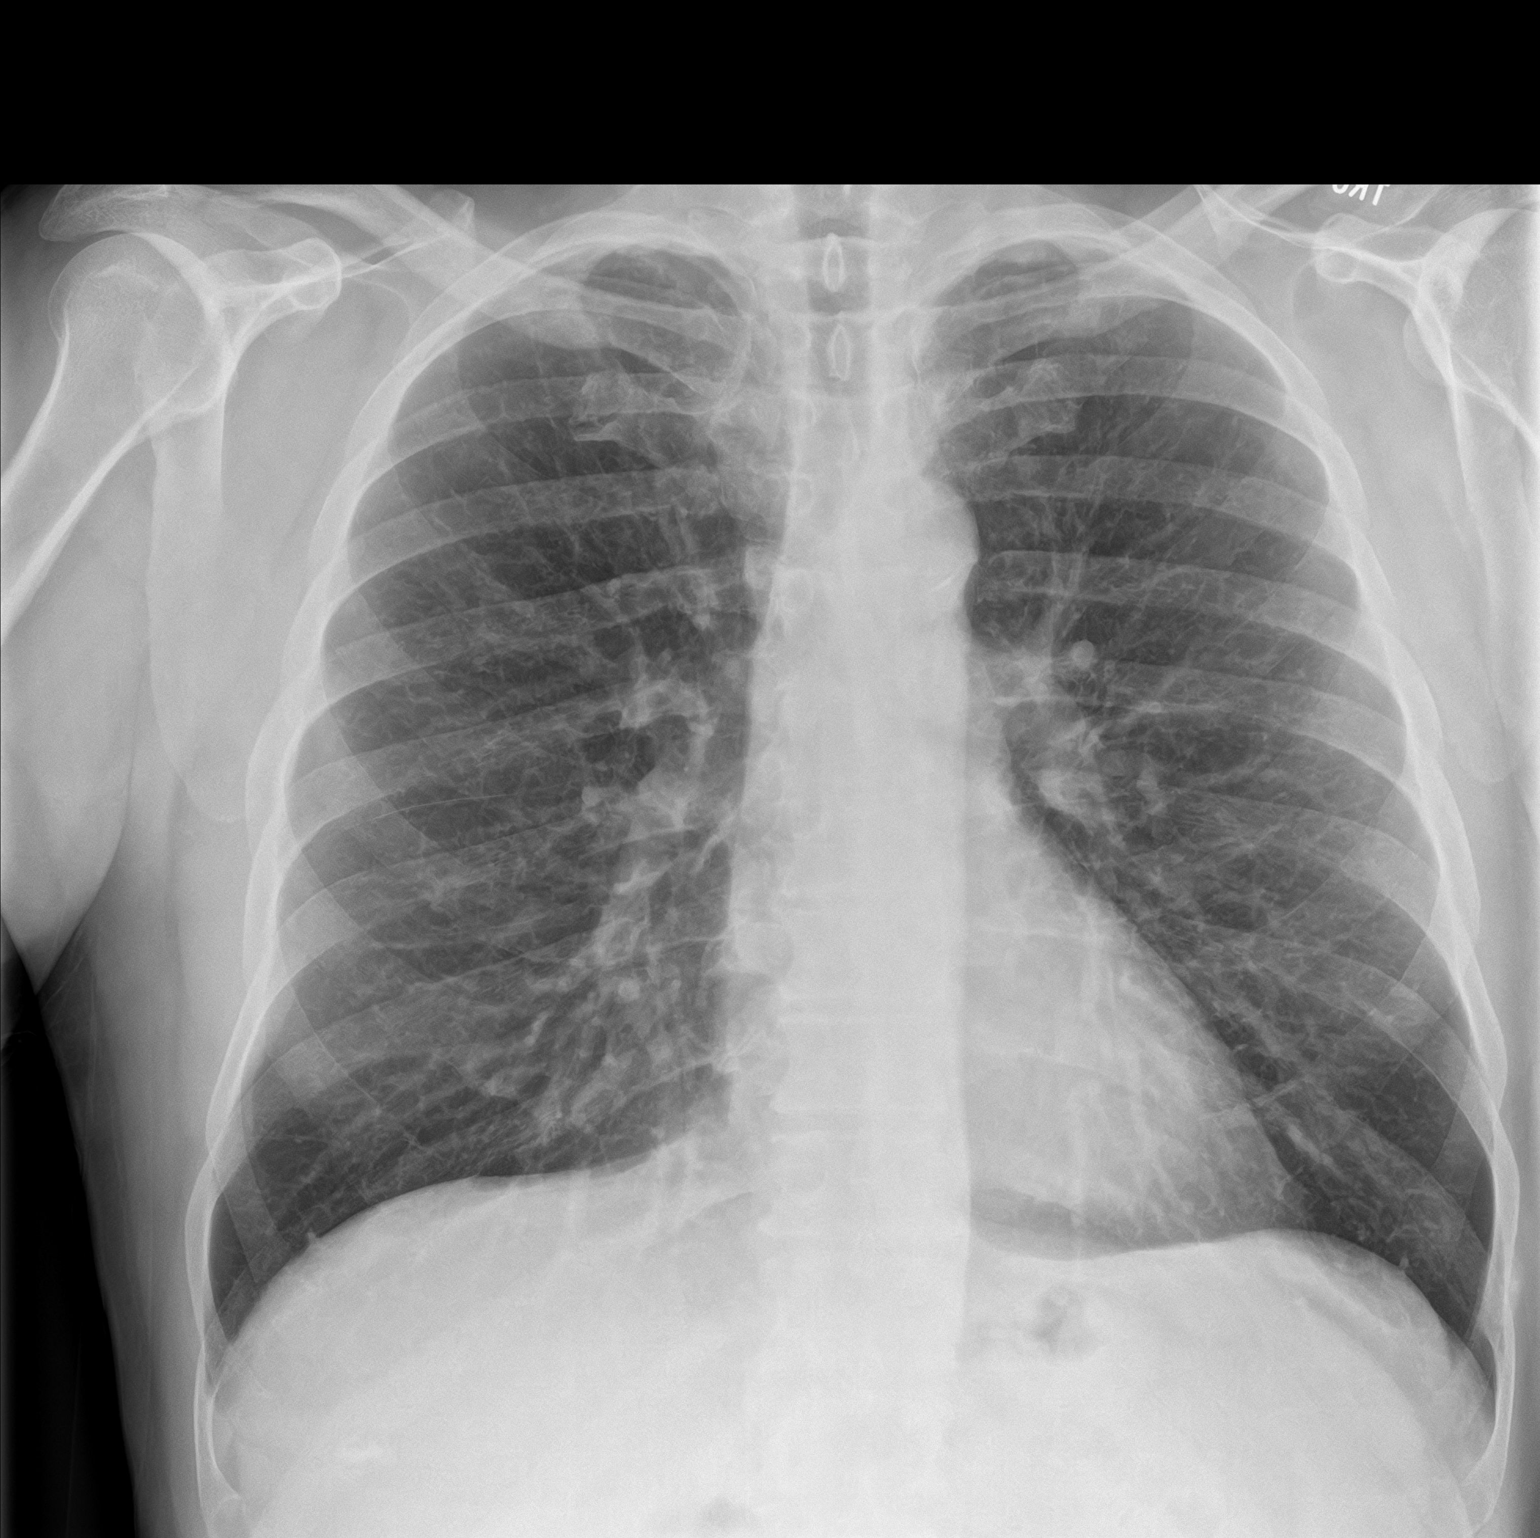
[im 2/2]
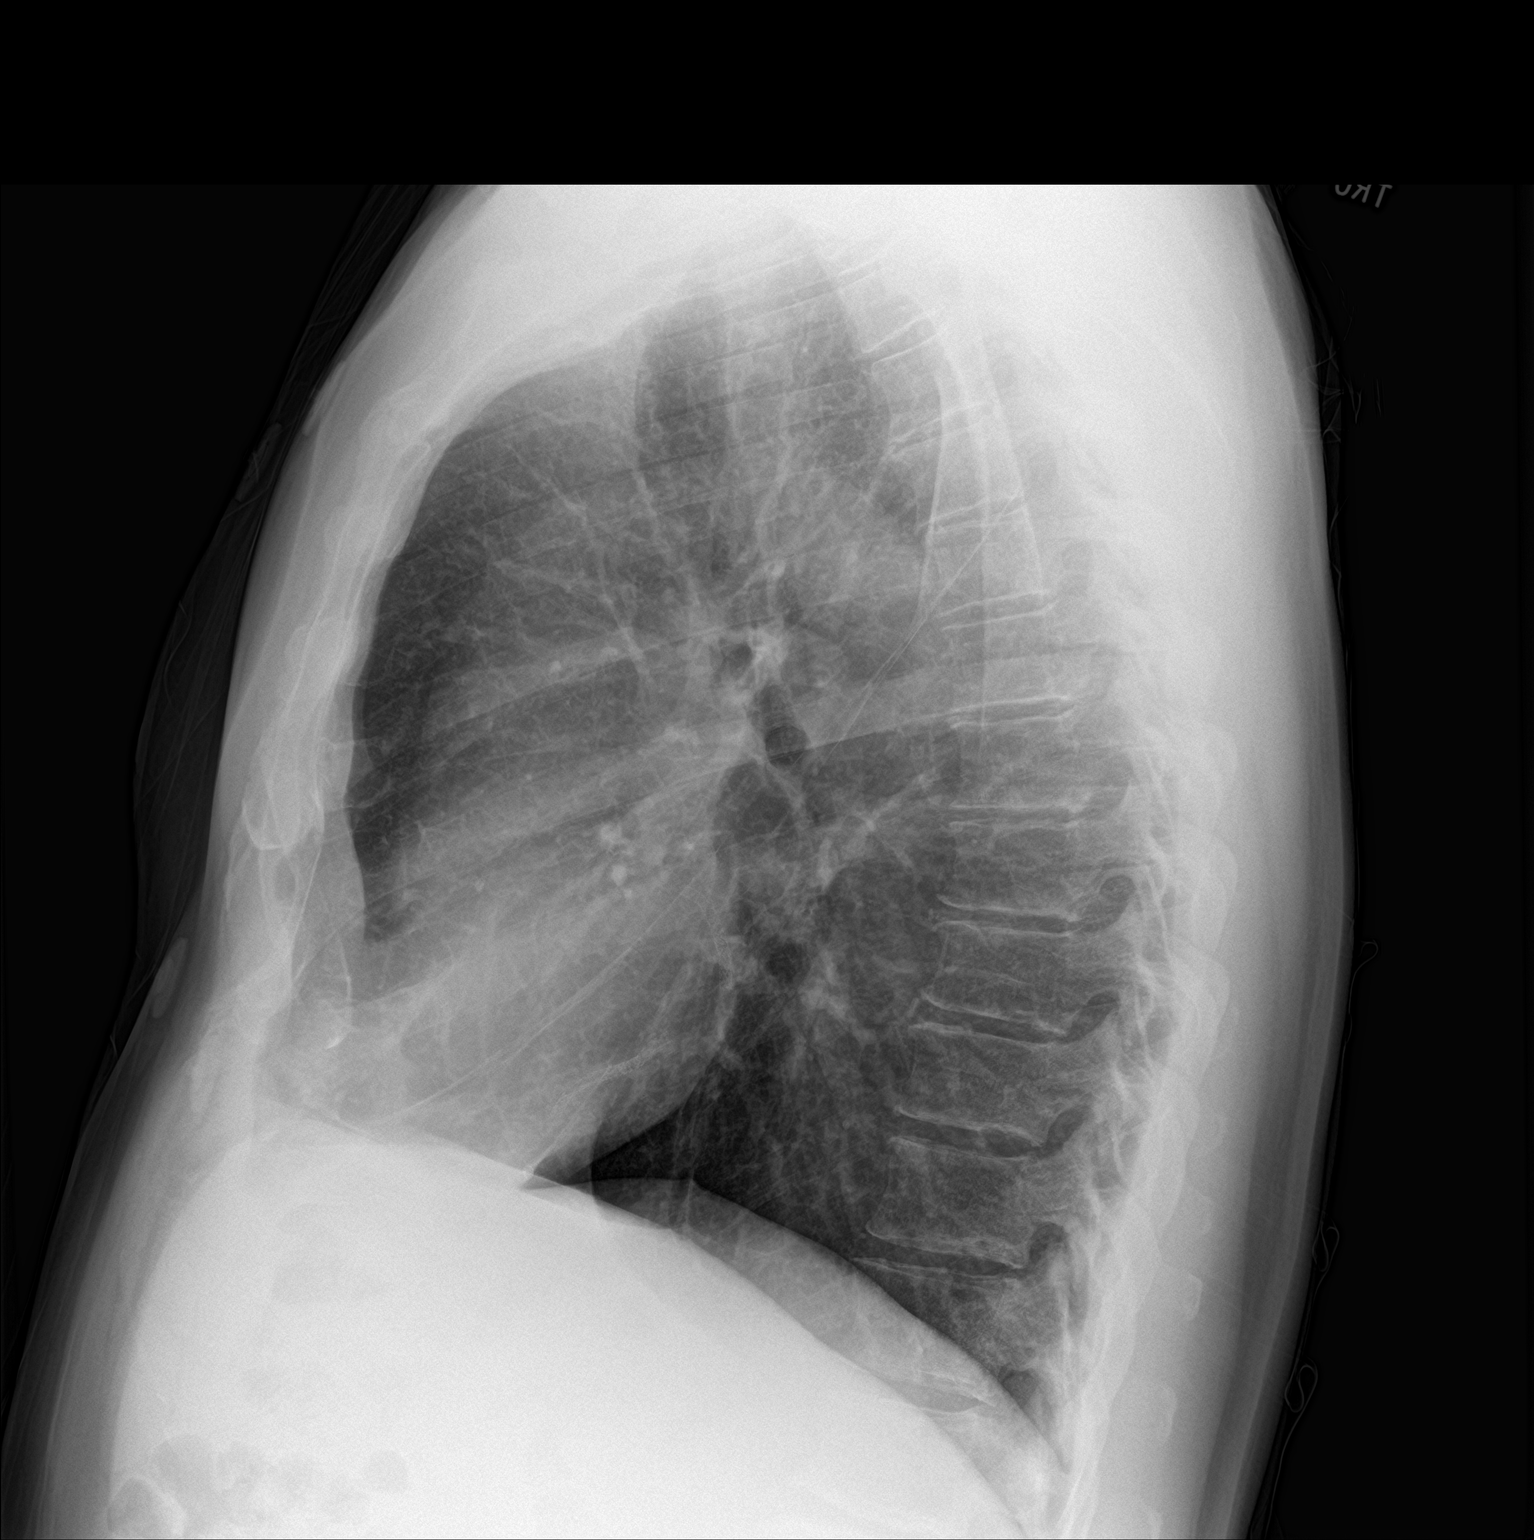

[2 of 2 positions shown; findings below may reference images not displayed]

FINDINGS: Lung volumes and mediastinal contours are stable. Lung volumes are
at the upper limits of normal. Visualized tracheal air column is
within normal limits. Stable increased interstitial lung markings
bilaterally with no pneumothorax, pulmonary edema, pleural effusion
or confluent pulmonary opacity.

No acute osseous abnormality identified. Negative visible bowel gas
pattern.
IMPRESSION: Stable.  No acute cardiopulmonary abnormality.
